# Patient Record
Sex: Female | Born: 1960 | Race: White | Hispanic: No | Marital: Married | Smoking: Never smoker
Health system: Southern US, Community
[De-identification: ages and names within clinical notes are randomized; demographics above are authoritative.]

## PROBLEM LIST (undated history)

## (undated) DIAGNOSIS — R112 Nausea with vomiting, unspecified: Secondary | ICD-10-CM

## (undated) DIAGNOSIS — M1990 Unspecified osteoarthritis, unspecified site: Secondary | ICD-10-CM

## (undated) DIAGNOSIS — Z9889 Other specified postprocedural states: Secondary | ICD-10-CM

## (undated) DIAGNOSIS — I10 Essential (primary) hypertension: Secondary | ICD-10-CM

## (undated) HISTORY — DX: Essential (primary) hypertension: I10

## (undated) HISTORY — PX: OTHER SURGICAL HISTORY: SHX169

## (undated) HISTORY — PX: TONSILLECTOMY: SUR1361

---

## 2006-10-07 ENCOUNTER — Ambulatory Visit (HOSPITAL_COMMUNITY): Admission: RE | Admit: 2006-10-07 | Discharge: 2006-10-07 | Payer: Self-pay | Admitting: Gastroenterology

## 2011-12-10 ENCOUNTER — Telehealth: Payer: Self-pay

## 2011-12-10 NOTE — Telephone Encounter (Signed)
Will forward to Katie per her request.  

## 2011-12-14 NOTE — Telephone Encounter (Signed)
This appt was moved to another provider as CY will not be in the office the day of her consult appt.Phone call complete.

## 2011-12-18 ENCOUNTER — Institutional Professional Consult (permissible substitution): Payer: Self-pay | Admitting: Internal Medicine

## 2011-12-23 ENCOUNTER — Ambulatory Visit (INDEPENDENT_AMBULATORY_CARE_PROVIDER_SITE_OTHER): Payer: 59 | Admitting: Internal Medicine

## 2011-12-23 ENCOUNTER — Ambulatory Visit (INDEPENDENT_AMBULATORY_CARE_PROVIDER_SITE_OTHER)
Admission: RE | Admit: 2011-12-23 | Discharge: 2011-12-23 | Disposition: A | Discharge status: Home / Self Care | Payer: 59 | Source: Ambulatory Visit | Attending: Internal Medicine | Admitting: Internal Medicine

## 2011-12-23 ENCOUNTER — Encounter: Payer: Self-pay | Admitting: Internal Medicine

## 2011-12-23 ENCOUNTER — Telehealth: Payer: Self-pay | Admitting: Internal Medicine

## 2011-12-23 VITALS — BP 132/84 | HR 86 | Temp 98.2°F | Ht 65.0 in | Wt 163.0 lb

## 2011-12-23 DIAGNOSIS — R059 Cough, unspecified: Secondary | ICD-10-CM | POA: Insufficient documentation

## 2011-12-23 DIAGNOSIS — R05 Cough: Secondary | ICD-10-CM

## 2011-12-23 MED ORDER — FAMOTIDINE 20 MG PO TABS: ORAL_TABLET | ORAL | Status: DC

## 2011-12-23 MED ORDER — DEXLANSOPRAZOLE 60 MG PO CPDR: 60.0000 mg | DELAYED_RELEASE_CAPSULE | Freq: Every day | ORAL | Status: DC

## 2011-12-23 MED ORDER — TRAMADOL HCL 50 MG PO TABS: ORAL_TABLET | ORAL | Status: AC

## 2011-12-23 NOTE — Progress Notes (Signed)
Quick Note:  Spoke with pt and notified of results per Dr. Wert. Pt verbalized understanding and denied any questions.  ______ 

## 2011-12-23 NOTE — Assessment & Plan Note (Signed)
The most common causes of chronic cough in immunocompetent adults include the following: upper airway cough syndrome (UACS), previously referred to as postnasal drip syndrome (PNDS), which is caused by variety of rhinosinus conditions; (2) asthma; (3) GERD; (4) chronic bronchitis from cigarette smoking or other inhaled environmental irritants; (5) nonasthmatic eosinophilic bronchitis; and (6) bronchiectasis.   These conditions, singly or in combination, have accounted for up to 94% of the causes of chronic cough in prospective studies.   Other conditions have constituted no >6% of the causes in prospective studies These have included bronchogenic carcinoma, chronic interstitial pneumonia, sarcoidosis, left ventricular failure, ACEI-induced cough, and aspiration from a condition associated with pharyngeal dysfunction. Chronic cough is often simultaneously caused by more than one condition. A single cause has been found from 38 to 82% of the time, multiple causes from 18 to 62%. Multiply caused cough has been the result of three diseases up to 42% of the time.   This is almost certainly a form of  Classic Upper airway cough syndrome, so named because it's frequently impossible to sort out how much is  CR/sinusitis with freq throat clearing (which can be related to primary GERD)   vs  causing  secondary (" extra esophageal")  GERD from wide swings in gastric pressure that occur with throat clearing, often  promoting self use of mint and menthol lozenges that reduce the lower esophageal sphincter tone and exacerbate the problem further in a cyclical fashion.   These are the same pts (now being labeled as having "irritable larynx syndrome" by some cough centers) who not infrequently have a history of having failed to tolerate ace inhibitors,  dry powder inhalers or biphosphonates or report having atypical reflux symptoms that don't respond to standard doses of PPI , and are easily confused as having aecopd or  asthma flares by even experienced allergists/ pulmonologists.   For now try max gerd rx and use 1st gen h1 to further dampen what is probably a vagally mediated reflex to reflux  Diet also reviewed.

## 2011-12-23 NOTE — Progress Notes (Signed)
Quick Note:  LMTCB ______ 

## 2011-12-23 NOTE — Progress Notes (Signed)
  Subjective:    Patient ID: Grace Pope, female    DOB: [birth year]  MRN: [id number]  HPI  49 yowf never regular smoker no childhood asthma with new onset cough Dec 2012 referred 12/23/2011 to pulmonary clinic by Dr Arelia Sneddon.  12/23/2011 1st pulmonary eval cc new onset cough spells abupt onset Dec 2012 [place] with days between episode fine but typical spell  starts with tickle in throat then severe cough itchy chin and can't stop for several hours initially late afternoon early evening rarely while sleeping.  No better with cough drops/ syrups or antihistamines.  In between can ex fine. Spells getting more freq and more severe to every 3rd day on avg now. Can't speak during spells. Nose starts running like a faucet during the episodes but no subjective wheeze and spells resolve p  Few hours regardless of what she takes for them, or takes nothing.  Denies choking on food or dysphagia.  In general, Sleeping ok without nocturnal  or early am exacerbation  of respiratory  c/o's or need for noct saba. Also denies any obvious fluctuation of symptoms with weather or environmental changes or other aggravating or alleviating factors except as outlined above     Review of Systems  Constitutional: Negative for fever, chills and unexpected weight change.  HENT: Positive for congestion and sneezing. Negative for ear pain, nosebleeds, sore throat, rhinorrhea, trouble swallowing, dental problem, voice change, postnasal drip and sinus pressure.   Eyes: Negative for visual disturbance.  Respiratory: Positive for cough and shortness of breath. Negative for choking.   Cardiovascular: Negative for chest pain and leg swelling.  Gastrointestinal: Negative for vomiting, abdominal pain and diarrhea.  Genitourinary: Negative for difficulty urinating.  Musculoskeletal: Negative for arthralgias.  Skin: Positive for rash.  Neurological: Positive for headaches. Negative for tremors and syncope.  Hematological: Does not  bruise/bleed easily.       Objective:   Physical Exam  Ambulatory wf nad  Wt 163 12/23/2011   HEENT: nl dentition, turbinates, and orophanx. Nl external ear canals without cough reflex   NECK :  without JVD/Nodes/TM/ nl carotid upstrokes bilaterally   LUNGS: no acc muscle use, clear to A and P bilaterally without cough on insp or exp maneuvers   CV:  RRR  no s3 or murmur or increase in P2, no edema   ABD:  soft and nontender with nl excursion in the supine position. No bruits or organomegaly, bowel sounds nl  MS:  warm without deformities, calf tenderness, cyanosis or clubbing  SKIN: warm and dry without lesions    NEURO:  alert, approp, no deficits   CXR  12/23/2011 :  1. No active disease. Mild degenerative changes mid thoracic spine.       Assessment & Plan:

## 2011-12-23 NOTE — Telephone Encounter (Signed)
Spoke with pt and notified of results per Dr. Wert. Pt verbalized understanding and denied any questions. 

## 2011-12-23 NOTE — Patient Instructions (Addendum)
GERD (REFLUX)  is an extremely common cause of respiratory symptoms just like yours, many times with no significant heartburn at all.    It can be treated with medication, but also with lifestyle changes including avoidance of late meals, excessive alcohol, smoking cessation, and avoid fatty foods, chocolate, peppermint, colas, red wine, and acidic juices such as orange juice.  NO MINT OR MENTHOL PRODUCTS SO NO COUGH DROPS  USE SUGARLESS CANDY INSTEAD (jolley ranchers or Stover's)  NO OIL BASED VITAMINS - use powdered substitutes. No fish oil    Dexilant 60 mg Take 30-60 min before first meal of the day and Pepcid 20 mg on at bedtime automatically for now  Take chlortrimeton 4 mg one every 4 hours as needed.   If still coughing, tramadol 50mg   Up to 1-2 every 4 hours.  Please remember to go to  x-ray department downstairs for your tests - we will call you with the results when they are available.    Please schedule a follow up office visit in 2 weeks, sooner if needed

## 2012-01-06 ENCOUNTER — Encounter: Payer: Self-pay | Admitting: Internal Medicine

## 2012-01-06 ENCOUNTER — Ambulatory Visit (INDEPENDENT_AMBULATORY_CARE_PROVIDER_SITE_OTHER): Payer: 59 | Admitting: Internal Medicine

## 2012-01-06 ENCOUNTER — Other Ambulatory Visit (INDEPENDENT_AMBULATORY_CARE_PROVIDER_SITE_OTHER): Payer: 59

## 2012-01-06 VITALS — BP 124/90 | HR 81 | Temp 98.2°F | Ht 65.0 in | Wt 162.0 lb

## 2012-01-06 DIAGNOSIS — R05 Cough: Secondary | ICD-10-CM

## 2012-01-06 LAB — CBC WITH DIFFERENTIAL/PLATELET
Basophils Absolute: 0 10*3/uL (ref 0.0–0.1)
Eosinophils Relative: 4.2 % (ref 0.0–5.0)
HCT: 42.9 % (ref 36.0–46.0)
Lymphocytes Relative: 22.2 % (ref 12.0–46.0)
Monocytes Relative: 7.6 % (ref 3.0–12.0)
Neutrophils Relative %: 65.6 % (ref 43.0–77.0)
Platelets: 204 10*3/uL (ref 150.0–400.0)
RDW: 12.9 % (ref 11.5–14.6)
WBC: 10.8 10*3/uL — ABNORMAL HIGH (ref 4.5–10.5)

## 2012-01-06 MED ORDER — FAMOTIDINE 20 MG PO TABS: ORAL_TABLET | ORAL | Status: DC

## 2012-01-06 MED ORDER — PREDNISONE (PAK) 10 MG PO TABS: ORAL_TABLET | ORAL | Status: AC

## 2012-01-06 NOTE — Patient Instructions (Addendum)
Please remember to go to the lab  department downstairs for your tests - we will call you with the results when they are available.    Zyrtec 10 mg one at bedtime  Prednisone 10 mg take  4 each am x 2 days,   2 each am x 2 days,  1 each am x2days and stop   Pepcid 20mg  one after bfast and one after supper  GERD (REFLUX)  is an extremely common cause of respiratory symptoms, many times with no significant heartburn at all.    It can be treated with medication, but also with lifestyle changes including avoidance of late meals, excessive alcohol, smoking cessation, and avoid fatty foods, chocolate, peppermint, colas, red wine, and acidic juices such as orange juice.  NO MINT OR MENTHOL PRODUCTS SO NO COUGH DROPS  USE SUGARLESS CANDY INSTEAD (jolley ranchers or Stover's)  NO OIL BASED VITAMINS - use powdered substitutes.    If cough, immediately take tramadol 50 mg one every 4 hours.  Please schedule a follow up office visit in 2 weeks, sooner if needed

## 2012-01-06 NOTE — Progress Notes (Signed)
Subjective:    Patient ID: Grace Pope, female    DOB: 1960/11/06  MRN: [id number]  HPI  73 yowf never regular smoker no childhood asthma with background pattern of daily cough x decades perhaps worse in the fall in the past  but acutely worse since Dec 2012 referred 12/23/2011 to pulmonary clinic by Dr Arelia Sneddon.  12/23/2011 1st pulmonary eval cc new onset severe cough spells abupt onset Dec 2012 [place] with days between episode "fine"  (= back to daily throat clearling)  but typical spell  starts with tickle in throat then severe cough itchy chin and can't stop for several hours initially late afternoon early evening rarely while sleeping.  No better with cough drops/ syrups or antihistamines.  In between can ex fine. Spells getting more freq and more severe to every 3rd day on avg now. Can't speak during spells. Nose starts running like a faucet during the episodes but no subjective wheeze and spells resolve p  Few hours regardless of what she takes for them, or takes nothing.  Denies choking on food or dysphagia. rec GERD diet  Dexilant 60 mg Take 30-60 min before first meal of the day and Pepcid 20 mg on at bedtime automatically for now Take chlortrimeton 4 mg one every 4 hours as needed.  If still coughing, tramadol 50mg   up to 1-2 every 4 hours.    01/06/2012 f/u ov/Bayley Hurn cc no pattern did not try chlortrimeton at onset, can't speak and wheezes during the episode, has not tried tramadol. No sob unless coughing. Takes voice away. No vomiting or gagging  In general, Sleeping ok without nocturnal  or early am exacerbation  of respiratory  c/o's or need for noct saba. Also denies any obvious fluctuation of symptoms with weather or environmental changes or other aggravating or alleviating factors except as outlined above.  ROS  At present neg for  any significant sore throat, dysphagia, dental problems, itching, sneezing,  nasal congestion or excess/ purulent secretions, ear ache,   fever,  chills, sweats, unintended wt loss, pleuritic or exertional cp, hemoptysis, palpitations, orthopnea pnd or leg swelling.  Also denies presyncope, palpitations, heartburn, abdominal pain, anorexia, nausea, vomiting, diarrhea  or change in bowel or urinary habits, change in stools or urine, dysuria,hematuria,  rash, arthralgias, visual complaints, headache, numbness weakness or ataxia or problems with walking or coordination. No noted change in mood/affect or memory.                           Objective:   Physical Exam  Ambulatory wf nad freq throat clearing during interview and exam  Wt 163 12/23/2011 > 01/06/2012  162  HEENT: nl dentition, turbinates, and orophanx. Nl external ear canals without cough reflex   NECK :  without JVD/Nodes/TM/ nl carotid upstrokes bilaterally   LUNGS: no acc muscle use, clear to A and P bilaterally without cough on insp or exp maneuvers   CV:  RRR  no s3 or murmur or increase in P2, no edema   ABD:  soft and nontender with nl excursion in the supine position. No bruits or organomegaly, bowel sounds nl  MS:  warm without deformities, calf tenderness, cyanosis or clubbing  SKIN: warm and dry without lesions    NEURO:  alert, approp, no deficits    CXR  12/23/2011 :  1. No active disease. Mild degenerative changes mid thoracic spine.       Assessment & Plan:

## 2012-01-07 ENCOUNTER — Encounter: Payer: Self-pay | Admitting: Internal Medicine

## 2012-01-07 LAB — ALLERGY PROFILE REGION II-DC, DE, MD, ~~LOC~~, VA
Allergen, D pternoyssinus,d7: <0.1 kU/L (ref <0.35)
Alternaria Alternata: <0.1 kU/L (ref <0.35)
Bermuda Grass: <0.1 kU/L (ref <0.35)
Box Elder IgE: <0.1 kU/L (ref <0.35)
Cat Dander: <0.1 kU/L (ref <0.35)
Cockroach: <0.1 kU/L (ref <0.35)
Common Ragweed: <0.1 kU/L (ref <0.35)
D. farinae: <0.1 kU/L (ref <0.35)
Dog Dander: <0.1 kU/L (ref <0.35)
Oak: <0.1 kU/L (ref <0.35)
Pecan/Hickory Tree IgE: <0.1 kU/L (ref <0.35)

## 2012-01-07 NOTE — Assessment & Plan Note (Addendum)
-   Allergy profile  01/06/2012 > no eos > IgE 3.2 no resp allergens identified  This is almost certainly a form of  Classic Upper airway cough syndrome, so named because it's frequently impossible to sort out how much is  CR/sinusitis with freq throat clearing (which can be related to primary GERD)   vs  causing  secondary (" extra esophageal")  GERD from wide swings in gastric pressure that occur with throat clearing, often  promoting self use of mint and menthol lozenges that reduce the lower esophageal sphincter tone and exacerbate the problem further in a cyclical fashion.   These are the same pts (now being labeled as having "irritable larynx syndrome" by some cough centers) who not infrequently have a history of having failed to tolerate ace inhibitors,  dry powder inhalers or biphosphonates or report having atypical reflux symptoms that don't respond to standard doses of PPI , and are easily confused as having aecopd or asthma flares by even experienced allergists/ pulmonologists.    New hx obtained today is that this throat clearing has been going on for 30 years and is likely not going to respond to conventional rx but anecdotally may respond to neurotin and or elavil. For now will see if we can aggressively interrupt the cough inducing gerd inducing cough cycle she's stuck in chronically and go from there.  See instructions for specific recommendations which were reviewed directly with the patient who was given a copy with highlighter outlining the key components.

## 2012-01-11 NOTE — Progress Notes (Signed)
Quick Note:  Spoke with pt and notified of results per Dr. Wert. Pt verbalized understanding and denied any questions.  ______ 

## 2012-01-21 ENCOUNTER — Ambulatory Visit: Payer: 59 | Admitting: Internal Medicine

## 2012-01-22 ENCOUNTER — Encounter: Payer: Self-pay | Admitting: Internal Medicine

## 2012-01-22 ENCOUNTER — Ambulatory Visit (INDEPENDENT_AMBULATORY_CARE_PROVIDER_SITE_OTHER): Payer: 59 | Admitting: Internal Medicine

## 2012-01-22 VITALS — BP 120/80 | HR 72 | Temp 97.9°F | Ht 65.0 in | Wt 163.0 lb

## 2012-01-22 DIAGNOSIS — R05 Cough: Secondary | ICD-10-CM

## 2012-01-22 NOTE — Progress Notes (Signed)
Subjective:    Patient ID: Grace Pope, female    DOB: Nov 12, 1960  MRN: [id number]  HPI  3 yowf never regular smoker no childhood asthma with background pattern of daily cough x decades perhaps worse in the fall in the past  but acutely worse since Dec 2012 referred 12/23/2011 to pulmonary clinic by Dr Arelia Sneddon.  12/23/2011 1st pulmonary eval cc new onset severe cough spells abupt onset Dec 2012 [place] with days between episode "fine"  (= back to daily throat clearling)  but typical spell  starts with tickle in throat then severe cough itchy chin and can't stop for several hours initially late afternoon early evening rarely while sleeping.  No better with cough drops/ syrups or antihistamines.  In between can ex fine. Spells getting more freq and more severe to every 3rd day on avg now. Can't speak during spells. Nose starts running like a faucet during the episodes but no subjective wheeze and spells resolve p  Few hours regardless of what she takes for them, or takes nothing.  Denies choking on food or dysphagia. rec GERD diet  Dexilant 60 mg Take 30-60 min before first meal of the day and Pepcid 20 mg on at bedtime automatically for now Take chlortrimeton 4 mg one every 4 hours as needed.  If still coughing, tramadol 50mg   up to 1-2 every 4 hours.    01/06/2012 f/u ov/Grace Pope cc no pattern did not try chlortrimeton at onset, can't speak and wheezes during the episode, has not tried tramadol. No sob unless coughing. Takes voice away. No vomiting or gagging. rec Please remember to go to the lab  department downstairs for your tests - we will call you with the results when they are available.    Zyrtec 10 mg one at bedtime  Prednisone 10 mg take  4 each am x 2 days,   2 each am x 2 days,  1 each am x2days and stop   Pepcid 20mg  one after bfast and one after supper  GERD diet   01/22/12 ov/ Grace Pope > complete resolution of all cough and throat clearing p rx with prednisone. No sob at all nor  hoarseness - not even an urge to clear her throat.     Sleeping ok without nocturnal  or early am exacerbation  of respiratory  c/o's or need for noct saba. Also denies any obvious fluctuation of symptoms with weather or environmental changes or other aggravating or alleviating factors except as outlined above.  ROS  At present neg for  any significant sore throat, dysphagia, dental problems, itching, sneezing,  nasal congestion or excess/ purulent secretions, ear ache,   fever, chills, sweats, unintended wt loss, pleuritic or exertional cp, hemoptysis, palpitations, orthopnea pnd or leg swelling.  Also denies presyncope, palpitations, heartburn, abdominal pain, anorexia, nausea, vomiting, diarrhea  or change in bowel or urinary habits, change in stools or urine, dysuria,hematuria,  rash, arthralgias, visual complaints, headache, numbness weakness or ataxia or problems with walking or coordination. No noted change in mood/affect or memory.                      Objective:   Physical Exam  Ambulatory wf nad    Wt 163 12/23/2011 > 01/06/2012  162> 01/22/12 wt 163   HEENT: nl dentition, turbinates, and orophanx. Nl external ear canals without cough reflex   NECK :  without JVD/Nodes/TM/ nl carotid upstrokes bilaterally   LUNGS: no acc muscle use, clear to  A and P bilaterally without cough on insp or exp maneuvers   CV:  RRR  no s3 or murmur or increase in P2, no edema   ABD:  soft and nontender with nl excursion in the supine position. No bruits or organomegaly, bowel sounds nl  MS:  warm without deformities, calf tenderness, cyanosis or clubbing  SKIN: warm and dry without lesions    NEURO:  alert, approp, no deficits    CXR  12/23/2011 :  1. No active disease. Mild degenerative changes mid thoracic spine.       Assessment & Plan:

## 2012-01-22 NOTE — Patient Instructions (Signed)
For severe cough take tramadol and suppress acid   The next step in the work up if get worse again is a methacholine challenge test call [phone] ask for [facility] and she'll schedule it and I will cause

## 2012-01-23 NOTE — Assessment & Plan Note (Signed)
Dramatic response to prednisone suggests either cough variant asthma, eos bronchitis or rhinitis, or, the most likely, that the cyclical cough mechanism going on for years generated secondary upper airways inflammation that only resolved when she stopped coughing long enough for the prednisone to stop the inflammation  Of the three most common causes of chronic cough, only one (GERD)  can actually cause the other two (asthma and post nasal drip syndrome)  and perpetuate the cylce of cough inducing airway trauma, inflammation, heightened sensitivity to reflux which is prompted by the cough itself via a cyclical mechanism.    This may partially respond to steroids and look like asthma and post nasal drainage but never erradicated completely unless the cough and the secondary reflux are eliminated, preferably both at the same time.  While not intuitively obvious, many patients with chronic low grade reflux do not cough until there is a secondary insult that disturbs the protective epithelial barrier and exposes sensitive nerve endings.  This can be viral or direct physical injury such as with an endotracheal tube.   The point is that once this occurs, it is difficult to eliminate using anything but a maximally effective acid suppression regimen at least in the short run, accompanied by an appropriate diet to address non acid GERD.   For now will just follow conservatively but the next step is a methacholine challenge while on gerd rx to r/o primary airways dz

## 2012-02-09 ENCOUNTER — Other Ambulatory Visit: Payer: Self-pay | Admitting: Dermatology

## 2012-02-12 ENCOUNTER — Other Ambulatory Visit: Payer: Self-pay | Admitting: Internal Medicine

## 2012-02-12 DIAGNOSIS — R05 Cough: Secondary | ICD-10-CM

## 2012-02-23 ENCOUNTER — Encounter (HOSPITAL_COMMUNITY): Payer: 59

## 2012-02-29 ENCOUNTER — Ambulatory Visit (HOSPITAL_COMMUNITY)
Admission: RE | Admit: 2012-02-29 | Discharge: 2012-02-29 | Disposition: A | Discharge status: Home / Self Care | Payer: 59 | Source: Ambulatory Visit | Attending: Internal Medicine | Admitting: Internal Medicine

## 2012-02-29 DIAGNOSIS — R05 Cough: Secondary | ICD-10-CM | POA: Insufficient documentation

## 2012-02-29 DIAGNOSIS — R059 Cough, unspecified: Secondary | ICD-10-CM | POA: Insufficient documentation

## 2012-02-29 LAB — PULMONARY FUNCTION TEST

## 2012-02-29 MED ORDER — SODIUM CHLORIDE 0.9 % IN NEBU
3.0000 mL | INHALATION_SOLUTION | Freq: Once | RESPIRATORY_TRACT | Status: AC
  Administered 2012-02-29: 3 mL via RESPIRATORY_TRACT

## 2012-02-29 MED ORDER — METHACHOLINE 4 MG/ML NEB SOLN
2.0000 mL | Freq: Once | RESPIRATORY_TRACT | Status: AC
  Administered 2012-02-29: 8 mg via RESPIRATORY_TRACT

## 2012-02-29 MED ORDER — METHACHOLINE 0.25 MG/ML NEB SOLN
2.0000 mL | Freq: Once | RESPIRATORY_TRACT | Status: AC
  Administered 2012-02-29: 0.5 mg via RESPIRATORY_TRACT

## 2012-02-29 MED ORDER — ALBUTEROL SULFATE (5 MG/ML) 0.5% IN NEBU
2.5000 mg | INHALATION_SOLUTION | Freq: Once | RESPIRATORY_TRACT | Status: AC
  Administered 2012-02-29: 2.5 mg via RESPIRATORY_TRACT

## 2012-02-29 MED ORDER — METHACHOLINE 16 MG/ML NEB SOLN
2.0000 mL | Freq: Once | RESPIRATORY_TRACT | Status: AC
  Administered 2012-02-29: 32 mg via RESPIRATORY_TRACT

## 2012-02-29 MED ORDER — METHACHOLINE 0.0625 MG/ML NEB SOLN
2.0000 mL | Freq: Once | RESPIRATORY_TRACT | Status: AC
  Administered 2012-02-29: 0.125 mg via RESPIRATORY_TRACT

## 2012-02-29 MED ORDER — METHACHOLINE 1 MG/ML NEB SOLN
2.0000 mL | Freq: Once | RESPIRATORY_TRACT | Status: AC
  Administered 2012-02-29: 2 mg via RESPIRATORY_TRACT

## 2012-03-02 ENCOUNTER — Telehealth: Payer: Self-pay | Admitting: Internal Medicine

## 2012-03-02 ENCOUNTER — Encounter: Payer: Self-pay | Admitting: Internal Medicine

## 2012-03-02 NOTE — Telephone Encounter (Signed)
Per MW MCT was neg for asthma, pt will need ov if still has co's.  ATC and inform the pt. LMTCB.

## 2012-03-03 NOTE — Telephone Encounter (Signed)
PT RETURNED CALL.  CALL HER BACK AT [phone] Grace Pope

## 2012-03-03 NOTE — Telephone Encounter (Signed)
Pt aware  Test results are negative for asthma. She verbalized understanding and said she would call if having any problems and make an appt.Marland Kitchen

## 2013-02-23 ENCOUNTER — Other Ambulatory Visit: Payer: Self-pay | Admitting: Dermatology

## 2015-05-09 HISTORY — PX: JOINT REPLACEMENT: SHX530

## 2015-06-17 ENCOUNTER — Encounter (HOSPITAL_COMMUNITY)
Admission: AD | Disposition: A | Discharge status: Home / Self Care | Payer: Self-pay | Source: Ambulatory Visit | Attending: Orthopedic Surgery

## 2015-06-17 ENCOUNTER — Encounter (HOSPITAL_COMMUNITY): Payer: Self-pay

## 2015-06-17 ENCOUNTER — Observation Stay (HOSPITAL_COMMUNITY): Payer: 59 | Admitting: Anesthesiology

## 2015-06-17 ENCOUNTER — Observation Stay (HOSPITAL_COMMUNITY)
Admission: AD | Admit: 2015-06-17 | Discharge: 2015-06-18 | Disposition: A | Discharge status: Home / Self Care | Payer: 59 | Source: Ambulatory Visit | Attending: Orthopedic Surgery | Admitting: Orthopedic Surgery

## 2015-06-17 DIAGNOSIS — M25562 Pain in left knee: Secondary | ICD-10-CM | POA: Insufficient documentation

## 2015-06-17 DIAGNOSIS — X58XXXA Exposure to other specified factors, initial encounter: Secondary | ICD-10-CM | POA: Insufficient documentation

## 2015-06-17 DIAGNOSIS — Z96652 Presence of left artificial knee joint: Secondary | ICD-10-CM | POA: Insufficient documentation

## 2015-06-17 DIAGNOSIS — T84028A Dislocation of other internal joint prosthesis, initial encounter: Principal | ICD-10-CM | POA: Insufficient documentation

## 2015-06-17 DIAGNOSIS — Z79899 Other long term (current) drug therapy: Secondary | ICD-10-CM | POA: Insufficient documentation

## 2015-06-17 DIAGNOSIS — I10 Essential (primary) hypertension: Secondary | ICD-10-CM | POA: Insufficient documentation

## 2015-06-17 DIAGNOSIS — G8918 Other acute postprocedural pain: Secondary | ICD-10-CM | POA: Diagnosis present

## 2015-06-17 DIAGNOSIS — Z791 Long term (current) use of non-steroidal anti-inflammatories (NSAID): Secondary | ICD-10-CM | POA: Insufficient documentation

## 2015-06-17 DIAGNOSIS — M25569 Pain in unspecified knee: Secondary | ICD-10-CM | POA: Diagnosis present

## 2015-06-17 HISTORY — DX: Other specified postprocedural states: R11.2

## 2015-06-17 HISTORY — DX: Other specified postprocedural states: Z98.890

## 2015-06-17 HISTORY — PX: I&D KNEE WITH POLY EXCHANGE: SHX5024

## 2015-06-17 LAB — SURGICAL PCR SCREEN
MRSA, PCR: NEGATIVE
Staphylococcus aureus: NEGATIVE

## 2015-06-17 LAB — CBC
HEMATOCRIT: 40.5 % (ref 36.0–46.0)
Hemoglobin: 13.9 g/dL (ref 12.0–15.0)
MCH: 32.2 pg (ref 26.0–34.0)
MCHC: 34.3 g/dL (ref 30.0–36.0)
MCV: 93.8 fL (ref 78.0–100.0)
PLATELETS: 229 10*3/uL (ref 150–400)
RBC: 4.32 MIL/uL (ref 3.87–5.11)
RDW: 12.9 % (ref 11.5–15.5)
WBC: 9.7 10*3/uL (ref 4.0–10.5)

## 2015-06-17 LAB — URINALYSIS, ROUTINE W REFLEX MICROSCOPIC
Bilirubin Urine: NEGATIVE
GLUCOSE, UA: NEGATIVE mg/dL
HGB URINE DIPSTICK: NEGATIVE
Ketones, ur: NEGATIVE mg/dL
Leukocytes, UA: NEGATIVE
Nitrite: NEGATIVE
PH: 7 (ref 5.0–8.0)
PROTEIN: NEGATIVE mg/dL
Specific Gravity, Urine: 1.01 (ref 1.005–1.030)
Urobilinogen, UA: 0.2 mg/dL (ref 0.0–1.0)

## 2015-06-17 SURGERY — IRRIGATION AND DEBRIDEMENT KNEE WITH POLY EXCHANGE
Anesthesia: General | Site: Knee | Laterality: Left

## 2015-06-17 MED ORDER — SCOPOLAMINE 1 MG/3DAYS TD PT72
1.0000 | MEDICATED_PATCH | Freq: Once | TRANSDERMAL | Status: DC
  Administered 2015-06-17: 1.5 mg via TRANSDERMAL
  Filled 2015-06-17: qty 1

## 2015-06-17 MED ORDER — HYDROCHLOROTHIAZIDE 12.5 MG PO CAPS
12.5000 mg | ORAL_CAPSULE | Freq: Every day | ORAL | Status: DC
  Filled 2015-06-17: qty 1

## 2015-06-17 MED ORDER — DOCUSATE SODIUM 100 MG PO CAPS
100.0000 mg | ORAL_CAPSULE | Freq: Two times a day (BID) | ORAL | Status: DC
  Administered 2015-06-17: 100 mg via ORAL

## 2015-06-17 MED ORDER — MIDAZOLAM HCL 5 MG/5ML IJ SOLN
INTRAMUSCULAR | Status: DC | PRN
  Administered 2015-06-17: 2 mg via INTRAVENOUS

## 2015-06-17 MED ORDER — MIDAZOLAM HCL 2 MG/2ML IJ SOLN
INTRAMUSCULAR | Status: AC
  Filled 2015-06-17: qty 4

## 2015-06-17 MED ORDER — ASPIRIN EC 325 MG PO TBEC
325.0000 mg | DELAYED_RELEASE_TABLET | Freq: Two times a day (BID) | ORAL | Status: DC
  Administered 2015-06-18: 325 mg via ORAL
  Filled 2015-06-17 (×3): qty 1

## 2015-06-17 MED ORDER — CEFAZOLIN SODIUM-DEXTROSE 2-3 GM-% IV SOLR
2.0000 g | Freq: Four times a day (QID) | INTRAVENOUS | Status: AC
  Administered 2015-06-17 – 2015-06-18 (×2): 2 g via INTRAVENOUS
  Filled 2015-06-17 (×2): qty 50

## 2015-06-17 MED ORDER — 0.9 % SODIUM CHLORIDE (POUR BTL) OPTIME
TOPICAL | Status: DC | PRN
  Administered 2015-06-17: 1000 mL

## 2015-06-17 MED ORDER — ONDANSETRON HCL 4 MG/2ML IJ SOLN
INTRAMUSCULAR | Status: DC | PRN
  Administered 2015-06-17: 4 mg via INTRAVENOUS

## 2015-06-17 MED ORDER — PROMETHAZINE HCL 25 MG/ML IJ SOLN
INTRAMUSCULAR | Status: AC
  Administered 2015-06-17: 12.5 mg
  Filled 2015-06-17: qty 1

## 2015-06-17 MED ORDER — METOCLOPRAMIDE HCL 10 MG PO TABS: 5.0000 mg | ORAL_TABLET | Freq: Three times a day (TID) | ORAL | Status: DC | PRN

## 2015-06-17 MED ORDER — METOCLOPRAMIDE HCL 5 MG/ML IJ SOLN: 5.0000 mg | Freq: Three times a day (TID) | INTRAMUSCULAR | Status: DC | PRN

## 2015-06-17 MED ORDER — PROPOFOL 10 MG/ML IV BOLUS
INTRAVENOUS | Status: AC
  Filled 2015-06-17: qty 20

## 2015-06-17 MED ORDER — ONDANSETRON HCL 4 MG/2ML IJ SOLN: 4.0000 mg | Freq: Four times a day (QID) | INTRAMUSCULAR | Status: DC | PRN

## 2015-06-17 MED ORDER — SODIUM CHLORIDE 0.9 % IJ SOLN
INTRAMUSCULAR | Status: DC | PRN
  Administered 2015-06-17: 30 mL

## 2015-06-17 MED ORDER — FERROUS SULFATE 325 (65 FE) MG PO TABS
325.0000 mg | ORAL_TABLET | Freq: Three times a day (TID) | ORAL | Status: DC
  Administered 2015-06-18: 325 mg via ORAL
  Filled 2015-06-17 (×4): qty 1

## 2015-06-17 MED ORDER — HYDROMORPHONE HCL 1 MG/ML IJ SOLN: 0.5000 mg | INTRAMUSCULAR | Status: DC | PRN

## 2015-06-17 MED ORDER — BUPIVACAINE-EPINEPHRINE (PF) 0.25% -1:200000 IJ SOLN
INTRAMUSCULAR | Status: AC
  Filled 2015-06-17: qty 30

## 2015-06-17 MED ORDER — HYDROMORPHONE HCL 1 MG/ML IJ SOLN: 0.2500 mg | INTRAMUSCULAR | Status: DC | PRN

## 2015-06-17 MED ORDER — SODIUM CHLORIDE 0.9 % IJ SOLN
INTRAMUSCULAR | Status: AC
  Filled 2015-06-17: qty 50

## 2015-06-17 MED ORDER — DIPHENHYDRAMINE HCL 25 MG PO CAPS: 25.0000 mg | ORAL_CAPSULE | Freq: Four times a day (QID) | ORAL | Status: DC | PRN

## 2015-06-17 MED ORDER — VALSARTAN-HYDROCHLOROTHIAZIDE 80-12.5 MG PO TABS: 1.0000 | ORAL_TABLET | Freq: Every day | ORAL | Status: DC

## 2015-06-17 MED ORDER — DEXAMETHASONE SODIUM PHOSPHATE 10 MG/ML IJ SOLN
INTRAMUSCULAR | Status: DC | PRN
  Administered 2015-06-17: 10 mg via INTRAVENOUS

## 2015-06-17 MED ORDER — SODIUM CHLORIDE 0.9 % IR SOLN
Status: DC | PRN
  Administered 2015-06-17: 3000 mL

## 2015-06-17 MED ORDER — FENTANYL CITRATE (PF) 250 MCG/5ML IJ SOLN
INTRAMUSCULAR | Status: AC
  Filled 2015-06-17: qty 25

## 2015-06-17 MED ORDER — KETOROLAC TROMETHAMINE 30 MG/ML IJ SOLN
INTRAMUSCULAR | Status: DC | PRN
  Administered 2015-06-17: 30 mg via INTRAMUSCULAR

## 2015-06-17 MED ORDER — ALUM & MAG HYDROXIDE-SIMETH 200-200-20 MG/5ML PO SUSP: 30.0000 mL | ORAL | Status: DC | PRN

## 2015-06-17 MED ORDER — PROPOFOL 10 MG/ML IV BOLUS
INTRAVENOUS | Status: DC | PRN
  Administered 2015-06-17: 20 mg via INTRAVENOUS
  Administered 2015-06-17: 200 mg via INTRAVENOUS

## 2015-06-17 MED ORDER — KETOROLAC TROMETHAMINE 30 MG/ML IJ SOLN
INTRAMUSCULAR | Status: AC
  Filled 2015-06-17: qty 1

## 2015-06-17 MED ORDER — LIDOCAINE HCL (CARDIAC) 20 MG/ML IV SOLN
INTRAVENOUS | Status: AC
  Filled 2015-06-17: qty 5

## 2015-06-17 MED ORDER — MAGNESIUM CITRATE PO SOLN: 1.0000 | Freq: Once | ORAL | Status: DC | PRN

## 2015-06-17 MED ORDER — HYDROMORPHONE HCL 1 MG/ML IJ SOLN
0.5000 mg | INTRAMUSCULAR | Status: DC | PRN
Start: 2015-06-17 — End: 2015-06-18

## 2015-06-17 MED ORDER — HYDROCODONE-ACETAMINOPHEN 7.5-325 MG PO TABS: 1.0000 | ORAL_TABLET | ORAL | Status: DC

## 2015-06-17 MED ORDER — CELECOXIB 200 MG PO CAPS
200.0000 mg | ORAL_CAPSULE | Freq: Two times a day (BID) | ORAL | Status: DC
  Administered 2015-06-17: 200 mg via ORAL
  Filled 2015-06-17 (×3): qty 1

## 2015-06-17 MED ORDER — BUPIVACAINE-EPINEPHRINE (PF) 0.25% -1:200000 IJ SOLN
INTRAMUSCULAR | Status: DC | PRN
  Administered 2015-06-17: 30 mL

## 2015-06-17 MED ORDER — IRBESARTAN 75 MG PO TABS
75.0000 mg | ORAL_TABLET | Freq: Every day | ORAL | Status: DC
  Filled 2015-06-17: qty 1

## 2015-06-17 MED ORDER — LACTATED RINGERS IV SOLN
INTRAVENOUS | Status: DC
  Administered 2015-06-17: 19:00:00 via INTRAVENOUS
  Administered 2015-06-17: 1000 mL via INTRAVENOUS

## 2015-06-17 MED ORDER — METHOCARBAMOL 1000 MG/10ML IJ SOLN
500.0000 mg | Freq: Four times a day (QID) | INTRAVENOUS | Status: DC | PRN
  Filled 2015-06-17: qty 5

## 2015-06-17 MED ORDER — SCOPOLAMINE 1 MG/3DAYS TD PT72
MEDICATED_PATCH | TRANSDERMAL | Status: AC
  Filled 2015-06-17: qty 1

## 2015-06-17 MED ORDER — PHENOL 1.4 % MT LIQD
1.0000 | OROMUCOSAL | Status: DC | PRN
Start: 2015-06-17 — End: 2015-06-18

## 2015-06-17 MED ORDER — MENTHOL 3 MG MT LOZG: 1.0000 | LOZENGE | OROMUCOSAL | Status: DC | PRN

## 2015-06-17 MED ORDER — DEXAMETHASONE SODIUM PHOSPHATE 10 MG/ML IJ SOLN
10.0000 mg | Freq: Once | INTRAMUSCULAR | Status: DC
  Filled 2015-06-17: qty 1

## 2015-06-17 MED ORDER — CEFAZOLIN SODIUM-DEXTROSE 2-3 GM-% IV SOLR
INTRAVENOUS | Status: AC
  Filled 2015-06-17: qty 50

## 2015-06-17 MED ORDER — ONDANSETRON HCL 4 MG PO TABS: 4.0000 mg | ORAL_TABLET | Freq: Four times a day (QID) | ORAL | Status: DC | PRN

## 2015-06-17 MED ORDER — LIDOCAINE HCL (CARDIAC) 20 MG/ML IV SOLN
INTRAVENOUS | Status: DC | PRN
  Administered 2015-06-17: 100 mg via INTRAVENOUS

## 2015-06-17 MED ORDER — CEFAZOLIN SODIUM-DEXTROSE 2-3 GM-% IV SOLR
2.0000 g | INTRAVENOUS | Status: AC
  Administered 2015-06-17: 2 g via INTRAVENOUS

## 2015-06-17 MED ORDER — METHOCARBAMOL 500 MG PO TABS: 500.0000 mg | ORAL_TABLET | Freq: Four times a day (QID) | ORAL | Status: DC | PRN

## 2015-06-17 MED ORDER — BISACODYL 10 MG RE SUPP: 10.0000 mg | Freq: Every day | RECTAL | Status: DC | PRN

## 2015-06-17 MED ORDER — SODIUM CHLORIDE 0.9 % IV SOLN
INTRAVENOUS | Status: DC
  Administered 2015-06-17: 23:00:00 via INTRAVENOUS
  Filled 2015-06-17 (×3): qty 1000

## 2015-06-17 MED ORDER — POLYETHYLENE GLYCOL 3350 17 G PO PACK: 17.0000 g | PACK | Freq: Two times a day (BID) | ORAL | Status: DC

## 2015-06-17 MED ORDER — FENTANYL CITRATE (PF) 100 MCG/2ML IJ SOLN
INTRAMUSCULAR | Status: DC | PRN
  Administered 2015-06-17 (×3): 50 ug via INTRAVENOUS

## 2015-06-17 SURGICAL SUPPLY — 61 items
BAG SPEC THK2 15X12 ZIP CLS (MISCELLANEOUS) ×1
BAG ZIPLOCK 12X15 (MISCELLANEOUS) ×3 IMPLANT
BANDAGE ELASTIC 4 VELCRO ST LF (GAUZE/BANDAGES/DRESSINGS) IMPLANT
BANDAGE ELASTIC 6 VELCRO ST LF (GAUZE/BANDAGES/DRESSINGS) ×3 IMPLANT
BANDAGE ESMARK 6X9 LF (GAUZE/BANDAGES/DRESSINGS) ×1 IMPLANT
BEARING MENISCAL TIBIAL 5 XS L (Orthopedic Implant) ×2 IMPLANT
BNDG CMPR 9X6 STRL LF SNTH (GAUZE/BANDAGES/DRESSINGS) ×1
BNDG ESMARK 6X9 LF (GAUZE/BANDAGES/DRESSINGS) ×3
BRNG TIB XS 5 PHS 3 UNCMP (Orthopedic Implant) ×1 IMPLANT
CUFF TOURN SGL QUICK 34 (TOURNIQUET CUFF) ×3
CUFF TRNQT CYL 34X4X40X1 (TOURNIQUET CUFF) ×1 IMPLANT
DECANTER SPIKE VIAL GLASS SM (MISCELLANEOUS) ×3 IMPLANT
DRAPE EXTREMITY T 121X128X90 (DRAPE) ×3 IMPLANT
DRAPE POUCH INSTRU U-SHP 10X18 (DRAPES) ×3 IMPLANT
DRAPE U-SHAPE 47X51 STRL (DRAPES) ×3 IMPLANT
DRSG ADAPTIC 3X8 NADH LF (GAUZE/BANDAGES/DRESSINGS) IMPLANT
DRSG AQUACEL AG ADV 3.5X10 (GAUZE/BANDAGES/DRESSINGS) ×2 IMPLANT
DRSG AQUACEL AG ADV 3.5X14 (GAUZE/BANDAGES/DRESSINGS) IMPLANT
DRSG PAD ABDOMINAL 8X10 ST (GAUZE/BANDAGES/DRESSINGS) IMPLANT
DURAPREP 26ML APPLICATOR (WOUND CARE) ×4 IMPLANT
ELECT REM PT RETURN 9FT ADLT (ELECTROSURGICAL) ×3
ELECTRODE REM PT RTRN 9FT ADLT (ELECTROSURGICAL) ×1 IMPLANT
FACESHIELD WRAPAROUND (MASK) ×15 IMPLANT
GAUZE SPONGE 4X4 12PLY STRL (GAUZE/BANDAGES/DRESSINGS) IMPLANT
GLOVE BIOGEL PI IND STRL 7.5 (GLOVE) ×1 IMPLANT
GLOVE BIOGEL PI IND STRL 8.5 (GLOVE) ×1 IMPLANT
GLOVE BIOGEL PI INDICATOR 7.5 (GLOVE) ×2
GLOVE BIOGEL PI INDICATOR 8.5 (GLOVE) ×2
GLOVE ECLIPSE 8.0 STRL XLNG CF (GLOVE) ×3 IMPLANT
GLOVE ORTHO TXT STRL SZ7.5 (GLOVE) ×3 IMPLANT
GOWN SPEC L3 XXLG W/TWL (GOWN DISPOSABLE) ×3 IMPLANT
GOWN STRL REUS W/TWL LRG LVL3 (GOWN DISPOSABLE) ×3 IMPLANT
HANDPIECE INTERPULSE COAX TIP (DISPOSABLE) ×3
KIT BASIN OR (CUSTOM PROCEDURE TRAY) ×3 IMPLANT
LIQUID BAND (GAUZE/BANDAGES/DRESSINGS) ×3 IMPLANT
MANIFOLD NEPTUNE II (INSTRUMENTS) ×3 IMPLANT
NDL SAFETY ECLIPSE 18X1.5 (NEEDLE) ×2 IMPLANT
NEEDLE HYPO 18GX1.5 SHARP (NEEDLE) ×6
NS IRRIG 1000ML POUR BTL (IV SOLUTION) ×4 IMPLANT
PACK TOTAL JOINT (CUSTOM PROCEDURE TRAY) ×3 IMPLANT
PADDING CAST COTTON 6X4 STRL (CAST SUPPLIES) IMPLANT
POSITIONER SURGICAL ARM (MISCELLANEOUS) ×1 IMPLANT
SET HNDPC FAN SPRY TIP SCT (DISPOSABLE) ×1 IMPLANT
SPONGE LAP 18X18 X RAY DECT (DISPOSABLE) IMPLANT
STAPLER VISISTAT 35W (STAPLE) IMPLANT
SUCTION FRAZIER 12FR DISP (SUCTIONS) ×3 IMPLANT
SUT MNCRL AB 3-0 PS2 18 (SUTURE) ×2 IMPLANT
SUT MNCRL AB 4-0 PS2 18 (SUTURE) ×2 IMPLANT
SUT VIC AB 1 CT1 36 (SUTURE) ×3 IMPLANT
SUT VIC AB 2-0 CT1 27 (SUTURE) ×9
SUT VIC AB 2-0 CT1 TAPERPNT 27 (SUTURE) ×3 IMPLANT
SUT VLOC 180 0 24IN GS25 (SUTURE) ×2 IMPLANT
SWAB COLLECTION DEVICE MRSA (MISCELLANEOUS) IMPLANT
SYR 50ML LL SCALE MARK (SYRINGE) ×3 IMPLANT
SYR CONTROL 10ML LL (SYRINGE) ×3 IMPLANT
TOWEL OR 17X26 10 PK STRL BLUE (TOWEL DISPOSABLE) ×3 IMPLANT
TRAY FOLEY W/METER SILVER 14FR (SET/KITS/TRAYS/PACK) ×1 IMPLANT
TRAY FOLEY W/METER SILVER 16FR (SET/KITS/TRAYS/PACK) ×1 IMPLANT
TUBE ANAEROBIC SPECIMEN COL (MISCELLANEOUS) IMPLANT
WATER STERILE IRR 1500ML POUR (IV SOLUTION) ×1 IMPLANT
WRAP KNEE MAXI GEL POST OP (GAUZE/BANDAGES/DRESSINGS) ×3 IMPLANT

## 2015-06-17 NOTE — Plan of Care (Signed)
Problem: Consults Goal: Total Joint Replacement Patient Education See Patient Education Module for education specifics. Outcome: Completed/Met Date Met:  06/17/15 Revision, UNI knee, Left

## 2015-06-17 NOTE — Brief Op Note (Signed)
06/17/2015  7:18 PM  PATIENT:  Grace Pope  54 y.o. 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   Delay start of Pharmacological VTE agent (>24hrs) due to surgical blood loss or risk of bleeding: no

## 2015-06-17 NOTE — Anesthesia Preprocedure Evaluation (Addendum)
Anesthesia Evaluation  Patient identified by MRN, date of birth, ID band Patient awake    History of Anesthesia Complications (+) PONV  Airway Mallampati: II  TM Distance: >3 FB Neck ROM: Full    Dental   Pulmonary neg pulmonary ROS,    breath sounds clear to auscultation       Cardiovascular hypertension,  Rhythm:Regular Rate:Normal     Neuro/Psych    GI/Hepatic negative GI ROS, Neg liver ROS,   Endo/Other  negative endocrine ROS  Renal/GU negative Renal ROS     Musculoskeletal   Abdominal   Peds  Hematology   Anesthesia Other Findings   Reproductive/Obstetrics                            Anesthesia Physical Anesthesia Plan  ASA: III  Anesthesia Plan:    Post-op Pain Management:    Induction: Intravenous  Airway Management Planned: LMA  Additional Equipment:   Intra-op Plan:   Post-operative Plan: Extubation in OR  Informed Consent: I have reviewed the patients History and Physical, chart, labs and discussed the procedure including the risks, benefits and alternatives for the proposed anesthesia with the patient or authorized representative who has indicated his/her understanding and acceptance.   Dental advisory given  Plan Discussed with: CRNA and Anesthesiologist  Anesthesia Plan Comments:         Anesthesia Quick Evaluation

## 2015-06-17 NOTE — H&P (Signed)
Grace Pope is an 54 y.o. female.    Chief Complaint: Acute left knee pain   HPI: Pt is a 54 y.o. female complaining of acute onset left knee pain during the night after twisting or rolling her leg.  She had immediate onset pain, difficulty with ambulation.  She contacted office and was seen this am by Leilani Able.  X-rays revealed dislocation of the polyethylene liner confirming clinical suspicion.  Findings reviewed with and X-rays reviewed.  Decided to add her on to the OR schedule today to address this acute problem.  PCP:  Lilia Argue  D/C Plans: To be determined following appropriate treatment plan  PMH: Past Medical History  Diagnosis Date  . Hypertension   . PONV (postoperative nausea and vomiting)     PSH: Past Surgical History  Procedure Laterality Date  . Joint replacement  05/09/2015    Partial left knee relacement  . Right knee arthroscopy    . Tonsillectomy      Social History:  reports that she has never smoked. She has never used smokeless tobacco. She reports that she drinks about 3.5 oz of alcohol per week. She reports that she does not use illicit drugs.  Allergies:  No Known Allergies  Medications: Medications Prior to Admission  Medication Sig Dispense Refill  . ibuprofen (ADVIL,MOTRIN) 200 MG tablet Take 400 mg by mouth every 6 (six) hours as needed for moderate pain.    Marland Kitchen KRILL OIL PO Take 1 capsule by mouth daily.    . Multiple Vitamin (MULTIVITAMIN) capsule Take 1 capsule by mouth daily.    . Naproxen Sod-Diphenhydramine (ALEVE PM) 220-25 MG TABS Take 1 tablet by mouth at bedtime as needed (for pain and sleep).    . naproxen sodium (ANAPROX) 220 MG tablet Take 220 mg by mouth 2 (two) times daily with a meal.    . psyllium (METAMUCIL) 58.6 % powder Take 1 packet by mouth daily.     . valsartan-hydrochlorothiazide (DIOVAN-HCT) 80-12.5 MG per tablet Take 1 tablet by mouth daily.      Results for orders placed or performed during the  hospital encounter of 06/17/15 (from the past 48 hour(s))  Urinalysis, Routine w reflex microscopic (not at [facility])     Status: None   Collection Time: 06/17/15 12:29 PM  Result Value Ref Range   Color, Urine YELLOW YELLOW   APPearance CLEAR CLEAR   Specific Gravity, Urine 1.010 1.005 - 1.030   pH 7.0 5.0 - 8.0   Glucose, UA NEGATIVE NEGATIVE mg/dL   Hgb urine dipstick NEGATIVE NEGATIVE   Bilirubin Urine NEGATIVE NEGATIVE   Ketones, ur NEGATIVE NEGATIVE mg/dL   Protein, ur NEGATIVE NEGATIVE mg/dL   Urobilinogen, UA 0.2 0.0 - 1.0 mg/dL   Nitrite NEGATIVE NEGATIVE   Leukocytes, UA NEGATIVE NEGATIVE    Comment: MICROSCOPIC NOT DONE ON URINES WITH NEGATIVE PROTEIN, BLOOD, LEUKOCYTES, NITRITE, OR GLUCOSE <1000 mg/dL.   No results found.  ROS: Review of Systems - Negative except that noted in her presenting history.  5-6 weeks out from left partial knee replacement  Physican Exam: Blood pressure 153/95, pulse 89, temperature 98.3 F (36.8 C), temperature source Oral, resp. rate 16.  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  Risks benefits and expectation were discussed with the patient. Patient understand risks, benefits and expectation and wishes to proceed.  Discharge to home tomorrow   Madlyn Frankel. Charlann Boxer, MD  06/17/2015, 2:41 PM

## 2015-06-17 NOTE — Anesthesia Postprocedure Evaluation (Signed)
  Anesthesia Post-op Note  Patient: Grace Pope  Procedure(s) Performed: Procedure(s): IRRIGATION AND DEBRIDEMENT KNEE WITH POLY EXCHANGE (Left)  Patient Location: PACU  Anesthesia Type:General  Level of Consciousness: awake  Airway and Oxygen Therapy: Patient Spontanous Breathing  Post-op Pain: mild  Post-op Assessment: Post-op Vital signs reviewed              Post-op Vital Signs: Reviewed  Last Vitals:  Filed Vitals:   06/17/15 2030  BP: 163/90  Pulse: 100  Temp: 36.8 C  Resp:     Complications: No apparent anesthesia complications

## 2015-06-17 NOTE — Transfer of Care (Signed)
Immediate Anesthesia Transfer of Care Note  Patient: Grace Pope  Procedure(s) Performed: Procedure(s): IRRIGATION AND DEBRIDEMENT KNEE WITH POLY EXCHANGE (Left)  Patient Location: PACU  Anesthesia Type:General  Level of Consciousness: sedated  Airway & Oxygen Therapy: Patient Spontanous Breathing and Patient connected to face mask oxygen  Post-op Assessment: Report given to RN and Post -op Vital signs reviewed and stable  Post vital signs: Reviewed and stable  Last Vitals:  Filed Vitals:   06/17/15 1541  BP: 137/73  Pulse: 82  Temp: 37.3 C  Resp: 18    Complications: No apparent anesthesia complications

## 2015-06-18 LAB — CBC
HEMATOCRIT: 38.2 % (ref 36.0–46.0)
HEMOGLOBIN: 13.1 g/dL (ref 12.0–15.0)
MCH: 32.1 pg (ref 26.0–34.0)
MCHC: 34.3 g/dL (ref 30.0–36.0)
MCV: 93.6 fL (ref 78.0–100.0)
Platelets: 232 10*3/uL (ref 150–400)
RBC: 4.08 MIL/uL (ref 3.87–5.11)
RDW: 12.8 % (ref 11.5–15.5)
WBC: 12 10*3/uL — ABNORMAL HIGH (ref 4.0–10.5)

## 2015-06-18 LAB — BASIC METABOLIC PANEL WITH GFR
Anion gap: 11 (ref 5–15)
BUN: 13 mg/dL (ref 6–20)
CO2: 24 mmol/L (ref 22–32)
Calcium: 9.4 mg/dL (ref 8.9–10.3)
Chloride: 104 mmol/L (ref 101–111)
Creatinine, Ser: 0.88 mg/dL (ref 0.44–1.00)
GFR calc Af Amer: >60 mL/min
GFR calc non Af Amer: >60 mL/min
Glucose, Bld: 168 mg/dL — ABNORMAL HIGH (ref 65–99)
Potassium: 4.2 mmol/L (ref 3.5–5.1)
Sodium: 139 mmol/L (ref 135–145)

## 2015-06-18 MED ORDER — FERROUS SULFATE 325 (65 FE) MG PO TABS: 325.0000 mg | ORAL_TABLET | Freq: Three times a day (TID) | ORAL | Status: DC

## 2015-06-18 MED ORDER — POLYETHYLENE GLYCOL 3350 17 G PO PACK: 17.0000 g | PACK | Freq: Two times a day (BID) | ORAL | Status: DC

## 2015-06-18 MED ORDER — HYDROCODONE-ACETAMINOPHEN 7.5-325 MG PO TABS: 1.0000 | ORAL_TABLET | ORAL | Status: DC | PRN

## 2015-06-18 MED ORDER — ASPIRIN 325 MG PO TBEC: 325.0000 mg | DELAYED_RELEASE_TABLET | Freq: Two times a day (BID) | ORAL | Status: AC

## 2015-06-18 MED ORDER — METHOCARBAMOL 500 MG PO TABS: 500.0000 mg | ORAL_TABLET | Freq: Four times a day (QID) | ORAL | Status: DC | PRN

## 2015-06-18 MED ORDER — DOCUSATE SODIUM 100 MG PO CAPS: 100.0000 mg | ORAL_CAPSULE | Freq: Two times a day (BID) | ORAL | Status: DC

## 2015-06-18 NOTE — Discharge Instructions (Signed)

## 2015-06-18 NOTE — Op Note (Signed)
Grace Pope, Grace Pope NO.:  [id number]  MEDICAL RECORD NO.:  [id number]  LOCATION:  1603                         FACILITY:  [facility]  PHYSICIAN:  Madlyn Frankel. Charlann Boxer, M.D.  DATE OF BIRTH:  07/20/1961  DATE OF PROCEDURE:  06/17/2015 DATE OF DISCHARGE:                              OPERATIVE REPORT   PREOPERATIVE DIAGNOSIS:  Status post left partial knee arthroplasty with an acute dislocation of the mobile bearing prosthesis.  POSTOPERATIVE DIAGNOSIS:  Status post left partial knee arthroplasty with an acute dislocation of the mobile bearing prosthesis.  PROCEDURE:  Left knee revision polyethylene, sizing up from size 4 polyethylene to a size 5 mm insert to match the extra-small femur on this left knee.  SURGEON:  Madlyn Frankel. Charlann Boxer, M.D.  ASSISTANT:  Surgical team.  ANESTHESIA:  General.  SPECIMENS:  None.  COMPLICATIONS:  None.  DRAINS:  None.  TOURNIQUET:  27 minutes at 250 mmHg.  FINDINGS:  The patient was noted to have no significant abnormalities that could explain the findings identified radiographically and clinically.  Her medial collateral ligament was intact, ACL intact. When I was in the operating room and trialed even with the size 4 trial, the knee felt stable.  I would like to go up to a size 5 mm to try to avert any potential other issues given this issue.  INDICATIONS FOR PROCEDURE:  Grace Pope is a very pleasant 54 year old female, within 6 weeks of her left partial knee arthroplasty and despite having some mechanical catching in her knee, recently resolved with physical therapy techniques.  She had been doing quite well, walking a mile yesterday.  She presented to the office today with an acute onset of pain after sleeping at night, rolling and tumbling around, moving from side-to-side where she felt some pop sensation in the knee. Radiographs in the office indicated a dislocated polyethylene insert.  Given these findings and acuity and  aggravation of the knee, we added her onto the OR case for today.  Risks, benefits, and necessity of the procedure were obviously reviewed.  Consent was obtained.  Risks of recurrent dislocation discussed.  Risks of infection, DVT, standard length reviewed as well.  Consent was obtained.  PROCEDURE IN DETAIL:  The patient was brought to the operative theater. Once adequate anesthesia, preoperative antibiotics, Ancef administered, she was positioned supine.  Her right unaffected extremity was held into the lithotomy leg holder.  With bony prominence padded, her left knee was positioned over the leg holder to allow for 120 degrees of flexion. Proximal thigh tourniquet placed.  The left lower extremity was then prepped and draped in sterile fashion.  Time-out was performed, identifying the patient, planned procedure, and extremity.  Leg was exsanguinated.  Tourniquet elevated to 250 mmHg.  Her old incision was utilized.  Soft tissue. Planes created.  Old sutures removed as identified.  I then made an arthrotomy and countering an old-appearing hemarthrosis.  The polyethylene had dislocated anteriorly and was easily removed.  Following an initial synovectomy, the soft tissues were visible as well as old suture.  I examined and identified the medial collateral ligament to be intact.  There was no evidence of any abnormality, bony or  otherwise identified through the incision site.  I then, at this point, trialed with both the size 4 and the size 5 lollipop, identified that the tension on the ligaments appeared to be appropriate, but elected to go with a size 5 to try to avert any other issues.  The new size 5 left medial insert to match the extra-small femur was snapped into place.  The knee was irrigated with normal saline solution. At this point, I reapproximated the extensor mechanism in flexion using #1 Vicryl.  The remaining wound was closed with 2-0 Vicryl and then a running 4-0  Monocryl.  The knee was cleaned, dried, and dressed sterilely using Dermabond.  At the time of closure, I injected the knee with 30 mL of 0.25% Marcaine with epinephrine, 1 mL of Toradol, and 30 mL of normal saline.  Upon conclusion of the case, the knee was cleaned, dried, and dressed sterilely using surgical glue, Aquacel dressing.  The knee was wrapped in Ace wrap.  She was then brought to the recovery room in stable condition, tolerating the procedure well.  Findings were reviewed with her husband.  She will stay in the hospital overnight for IV antibiotics, will be discharged tomorrow with activity as tolerated.     Madlyn Frankel Charlann Boxer, M.D.     MDO/MEDQ  D:  06/17/2015  T:  06/18/2015  Job:  [id number]

## 2015-06-18 NOTE — Progress Notes (Signed)
Patient refused to take the scripts for hydrocodone and robaxin at discharge. Charge nurse Danna Susann Givens and [employer] PA notified. Scripts were placed in shredder in [place] Franklin's presence.

## 2015-06-18 NOTE — Care Management Note (Signed)
Case Management Note  Patient Details  Name: Grace Pope MRN: [id number] Date of Birth: 1960-11-24  Subjective/Objective:                   IRRIGATION AND DEBRIDEMENT KNEE WITH POLY EXCHANGE (Left) Action/Plan:  Discharge planning Expected Discharge Date:  09/17/14               Expected Discharge Plan:  Home/Self Care  In-House Referral:     Discharge planning Services  CM Consult  Post Acute Care Choice:  NA Choice offered to:  NA  DME Arranged:  N/A DME Agency:  NA  HH Arranged:  NA HH Agency:     Status of Service:  Completed, signed off  Medicare Important Message Given:    Date Medicare IM Given:    Medicare IM give by:    Date Additional Medicare IM Given:    Additional Medicare Important Message give by:     If discussed at Long Length of Stay Meetings, dates discussed:    Additional Comments: CM notes pt has DME from previous surgery and no HHC ordered or recc.  No other CM needs were communicated. Yves Dill, RN 06/18/2015, 11:21 AM

## 2015-06-18 NOTE — Progress Notes (Signed)
Patient ID: Grace Pope, female   DOB: 1961/07/04, 54 y.o.   MRN: [id number] Subjective: 1 Day Post-Op Procedure(s) (LRB): IRRIGATION AND DEBRIDEMENT KNEE WITH POLY EXCHANGE (Left)    Patient reports pain as mild.  Very little discomfort  Objective:   VITALS:   Filed Vitals:   06/18/15 0654  BP: 128/76  Pulse: 65  Temp: 98.2 F (36.8 C)  Resp: 18    Neurovascular intact Incision: dressing C/D/I  LABS  Recent Labs  06/17/15 1440 06/18/15 0456  HGB 13.9 13.1  HCT 40.5 38.2  WBC 9.7 12.0*  PLT 229 232     Recent Labs  06/18/15 0456  NA 139  K 4.2  BUN 13  CREATININE 0.88  GLUCOSE 168*    No results for input(s): LABPT, INR in the last 72 hours.   Assessment/Plan: 1 Day Post-Op Procedure(s) (LRB): IRRIGATION AND DEBRIDEMENT KNEE WITH POLY EXCHANGE (Left)  Home this am No restrictions Therapy set up for Monday, she other wise knows what to do No therapy needed today

## 2015-06-19 ENCOUNTER — Encounter (HOSPITAL_COMMUNITY): Payer: Self-pay | Admitting: Orthopedic Surgery

## 2015-06-20 NOTE — Discharge Summary (Signed)
Physician Discharge Summary  Patient ID: Grace Pope MRN: [id number] DOB/AGE: 1961/04/26 54 y.o.  Admit date: 06/17/2015 Discharge date: 06/18/2015   Procedures:  Procedure(s) (LRB): IRRIGATION AND DEBRIDEMENT KNEE WITH POLY EXCHANGE (Left)  Attending Physician:  Dr. Durene Romans   Admission Diagnoses:   Acute left knee pain   Discharge Diagnoses:  Active Problems:   Acute postoperative pain of left knee   Acute postoperative pain of knee  Past Medical History  Diagnosis Date  . Hypertension   . PONV (postoperative nausea and vomiting)     HPI:    Pt is a 54 y.o. female complaining of acute onset left knee pain during the night after twisting or rolling her leg. She had immediate onset pain, difficulty with ambulation. She contacted office and was seen this am by Leilani Able. X-rays revealed dislocation of the polyethylene liner confirming clinical suspicion. Findings reviewed with and X-rays reviewed. Decided to add her on to the OR schedule today to address this acute problem.  PCP: Lilia Argue   Discharged Condition: good  Hospital Course:  Patient underwent the above stated procedure on 06/17/2015. Patient tolerated the procedure well and brought to the recovery room in good condition and subsequently to the floor.  POD #1 BP: 128/76 ; Pulse: 65 ; Temp: 98.2 F (36.8 C) ; Resp: 18 Patient reports pain as mild. Very little discomfort.  Ready to be discharged home. Dorsiflexion/plantar flexion intact, incision: dressing C/D/I, no cellulitis present and compartment soft.   LABS  Basename    HGB  13.1  HCT  38.2    Discharge Exam: General appearance: alert, cooperative and no distress Extremities: Homans sign is negative, no sign of DVT, no edema, redness or tenderness in the calves or thighs and no ulcers, gangrene or trophic changes  Disposition: Home with follow up in 2 weeks   Follow-up Information    Follow up with Shelda Pal, MD.  Schedule an appointment as soon as possible for a visit in 2 weeks.   Specialty:  Orthopedic Surgery   Contact information:   [address] [place] [address] [phone]       Discharge Instructions    Call MD / Call 911    Complete by:  As directed   If you experience chest pain or shortness of breath, CALL 911 and be transported to the hospital emergency room.  If you develope a fever above 101 F, pus (white drainage) or increased drainage or redness at the wound, or calf pain, call your surgeon's office.     Change dressing    Complete by:  As directed   Maintain surgical dressing until follow up in the clinic. If the edges start to pull up, may reinforce with tape. If the dressing is no longer working, may remove and cover with gauze and tape, but must keep the area dry and clean.  Call with any questions or concerns.     Constipation Prevention    Complete by:  As directed   Drink plenty of fluids.  Prune juice may be helpful.  You may use a stool softener, such as Colace (over the counter) 100 mg twice a day.  Use MiraLax (over the counter) for constipation as needed.     Diet - low sodium heart healthy    Complete by:  As directed      Discharge instructions    Complete by:  As directed   Maintain surgical dressing until follow up in  the clinic. If the edges start to pull up, may reinforce with tape. If the dressing is no longer working, may remove and cover with gauze and tape, but must keep the area dry and clean.  Follow up in 2 weeks at [facility] Call with any questions or concerns.     Increase activity slowly as tolerated    Complete by:  As directed   Weight bearing as tolerated with assist device (walker, cane, etc) as directed, use it as long as suggested by your surgeon or therapist, typically at least 4-6 weeks.     TED hose    Complete by:  As directed   Use stockings (TED hose) for 2 weeks on both leg(s).  You may remove them at  night for sleeping.             Medication List    STOP taking these medications        ALEVE PM 220-25 MG Tabs  Generic drug:  Naproxen Sod-Diphenhydramine     ibuprofen 200 MG tablet  Commonly known as:  ADVIL,MOTRIN     naproxen sodium 220 MG tablet  Commonly known as:  ANAPROX      TAKE these medications        aspirin 325 MG EC tablet  Take 1 tablet (325 mg total) by mouth 2 (two) times daily.     docusate sodium 100 MG capsule  Commonly known as:  COLACE  Take 1 capsule (100 mg total) by mouth 2 (two) times daily.     ferrous sulfate 325 (65 FE) MG tablet  Take 1 tablet (325 mg total) by mouth 3 (three) times daily after meals.     HYDROcodone-acetaminophen 7.5-325 MG tablet  Commonly known as:  NORCO  Take 1-2 tablets by mouth every 4 (four) hours as needed for moderate pain.     KRILL OIL PO  Take 1 capsule by mouth daily.     methocarbamol 500 MG tablet  Commonly known as:  ROBAXIN  Take 1 tablet (500 mg total) by mouth every 6 (six) hours as needed for muscle spasms.     multivitamin capsule  Take 1 capsule by mouth daily.     polyethylene glycol packet  Commonly known as:  MIRALAX / GLYCOLAX  Take 17 g by mouth 2 (two) times daily.     psyllium 58.6 % powder  Commonly known as:  METAMUCIL  Take 1 packet by mouth daily.     valsartan-hydrochlorothiazide 80-12.5 MG tablet  Commonly known as:  DIOVAN-HCT  Take 1 tablet by mouth daily.         Signed: Anastasio Auerbach. Emmagene Ortner   PA-C  06/20/2015, 11:44 AM

## 2015-08-30 ENCOUNTER — Emergency Department (HOSPITAL_COMMUNITY)
Admission: EM | Admit: 2015-08-30 | Discharge: 2015-08-30 | Disposition: A | Discharge status: Home / Self Care | Payer: 59 | Attending: Emergency Medicine | Admitting: Emergency Medicine

## 2015-08-30 ENCOUNTER — Encounter (HOSPITAL_COMMUNITY): Payer: Self-pay | Admitting: *Deleted

## 2015-08-30 ENCOUNTER — Emergency Department (HOSPITAL_COMMUNITY): Payer: 59

## 2015-08-30 DIAGNOSIS — I10 Essential (primary) hypertension: Secondary | ICD-10-CM | POA: Diagnosis not present

## 2015-08-30 DIAGNOSIS — Y9289 Other specified places as the place of occurrence of the external cause: Secondary | ICD-10-CM | POA: Insufficient documentation

## 2015-08-30 DIAGNOSIS — Y998 Other external cause status: Secondary | ICD-10-CM | POA: Insufficient documentation

## 2015-08-30 DIAGNOSIS — M25562 Pain in left knee: Secondary | ICD-10-CM

## 2015-08-30 DIAGNOSIS — Z9889 Other specified postprocedural states: Secondary | ICD-10-CM | POA: Diagnosis not present

## 2015-08-30 DIAGNOSIS — Z79899 Other long term (current) drug therapy: Secondary | ICD-10-CM | POA: Insufficient documentation

## 2015-08-30 DIAGNOSIS — S8992XA Unspecified injury of left lower leg, initial encounter: Secondary | ICD-10-CM | POA: Insufficient documentation

## 2015-08-30 DIAGNOSIS — Y9389 Activity, other specified: Secondary | ICD-10-CM | POA: Insufficient documentation

## 2015-08-30 DIAGNOSIS — W06XXXA Fall from bed, initial encounter: Secondary | ICD-10-CM | POA: Diagnosis not present

## 2015-08-30 DIAGNOSIS — Z791 Long term (current) use of non-steroidal anti-inflammatories (NSAID): Secondary | ICD-10-CM | POA: Diagnosis not present

## 2015-08-30 NOTE — ED Notes (Addendum)
Pt states she heard a pop at 730PM and felt pain in her left knee, was unable to bear weight. Pt states she had partial knee replacement surgery in August, which she states slipped out of place in October.Pt states she feels like her knee has slipped out of place again.

## 2015-08-30 NOTE — ED Provider Notes (Signed)
History  By signing my name below, I, Karle Plumber, attest that this documentation has been prepared under the direction and in the presence of [employer], PA-C. Electronically Signed: Karle Plumber, ED Scribe. 08/30/2015. 9:57 PM.  Chief Complaint  Patient presents with  . Knee Pain   The history is provided by the patient and medical records. No language interpreter was used.    HPI Comments:  Grace Pope is a 54 y.o. female who presents to the Emergency Department complaining of left knee soreness that began about 3.5 hours ago. Pt states she had a partial left knee replacement four months ago by Dr. Charlann Boxer. She reports that 5 weeks later one of the pieces slipped out of place and she had to have surgery again to fix it. She states about 3.5 hours ago she rolled over in bed and heard a pop and immediately felt pain in the knee. She applied her knee brace and has not been weight bearing since. She has not taken anything for pain. Bearing weight and certain movements of the left knee increase the pain. Resting the left knee helps to alleviate the pain. She denies numbness, tingling or weakness of the LLE, bruising or wounds. Pt has been ambulatory with crutches.  Past Medical History  Diagnosis Date  . Hypertension   . PONV (postoperative nausea and vomiting)    Past Surgical History  Procedure Laterality Date  . Joint replacement  05/09/2015    Partial left knee relacement  . Right knee arthroscopy    . Tonsillectomy    . I&d knee with poly exchange Left 06/17/2015    Procedure: IRRIGATION AND DEBRIDEMENT KNEE WITH POLY EXCHANGE;  Surgeon: Durene Romans, MD;  Location: [facility];  Service: Orthopedics;  Laterality: Left;   No family history on file. Social History  Substance Use Topics  . Smoking status: Never Smoker   . Smokeless tobacco: Never Used  . Alcohol Use: 3.5 oz/week    7 Standard drinks or equivalent per week   OB History    No data available     Review of  Systems  Musculoskeletal: Positive for arthralgias.  All other systems reviewed and are negative.   Allergies  Review of patient's allergies indicates no known allergies.  Home Medications   Prior to Admission medications   Medication Sig Start Date End Date Taking? Authorizing Provider  KRILL OIL PO Take 1 capsule by mouth daily.   Yes Historical Provider, MD  Multiple Vitamin (MULTIVITAMIN) capsule Take 1 capsule by mouth daily.   Yes Historical Provider, MD  Naproxen Sod-Diphenhydramine (ALEVE PM) 220-25 MG TABS Take 1 tablet by mouth at bedtime.   Yes Historical Provider, MD  valsartan-hydrochlorothiazide (DIOVAN-HCT) 80-12.5 MG per tablet Take 1 tablet by mouth daily.   Yes Historical Provider, MD  docusate sodium (COLACE) 100 MG capsule Take 1 capsule (100 mg total) by mouth 2 (two) times daily. Patient not taking: Reported on 08/30/2015 06/18/15   Lanney Gins, PA-C  ferrous sulfate 325 (65 FE) MG tablet Take 1 tablet (325 mg total) by mouth 3 (three) times daily after meals. Patient not taking: Reported on 08/30/2015 06/18/15   Lanney Gins, PA-C  HYDROcodone-acetaminophen (NORCO) 7.5-325 MG tablet Take 1-2 tablets by mouth every 4 (four) hours as needed for moderate pain. Patient not taking: Reported on 08/30/2015 06/18/15   Lanney Gins, PA-C  methocarbamol (ROBAXIN) 500 MG tablet Take 1 tablet (500 mg total) by mouth every 6 (six) hours as needed for muscle spasms.  Patient not taking: Reported on 08/30/2015 06/18/15   Lanney GinsMatthew Babish, PA-C  polyethylene glycol (MIRALAX / Ethelene HalGLYCOLAX) packet Take 17 g by mouth 2 (two) times daily. Patient not taking: Reported on 08/30/2015 06/18/15   Lanney GinsMatthew Babish, PA-C   Triage Vitals: BP 162/96 mmHg  Pulse 122  Temp(Src) 98 F (36.7 C) (Oral)  Resp 18  SpO2 98%  LMP 08/23/2015  Physical Exam  Constitutional: She is oriented to person, place, and time. She appears well-developed and well-nourished. No distress.  HENT:  Head:  Normocephalic and atraumatic.  Eyes: Conjunctivae and EOM are normal. No scleral icterus.  Neck: Normal range of motion.  Cardiovascular: Intact distal pulses.   DP and PT pulses 2+ in the LLE  Pulmonary/Chest: Effort normal. No respiratory distress.  Musculoskeletal: Normal range of motion. She exhibits tenderness.       Left knee: She exhibits swelling (mild). She exhibits normal range of motion, no effusion, no deformity, no erythema, no LCL laxity and no MCL laxity. Tenderness found. Medial joint line tenderness noted.       Legs: TTP at and superior to the medial joint line. Preserved ROM of the L knee. No effusion, deformity, or crepitus.  Neurological: She is alert and oriented to person, place, and time. She exhibits normal muscle tone. Coordination normal.  Sensation to light touch intact. Patient weight bearing with some discomfort.  Skin: Skin is warm and dry. No rash noted. She is not diaphoretic. No erythema. No pallor.  Psychiatric: She has a normal mood and affect. Her behavior is normal.  Nursing note and vitals reviewed.   ED Course  Procedures (including critical care time) DIAGNOSTIC STUDIES: Oxygen Saturation is 98% on RA, normal by my interpretation.   COORDINATION OF CARE: 9:54 PM- Will contact radiology about X-Ray. Pt verbalizes understanding and agrees to plan.  Medications - No data to display  Labs Review Labs Reviewed - No data to display  Imaging Review Dg Knee Complete 4 Views Left  08/30/2015  CLINICAL DATA:  Pain in knee.  Difficulty weight-bearing. EXAM: LEFT KNEE - COMPLETE 4+ VIEW COMPARISON:  None. FINDINGS: Previous left knee hemi arthroplasty with replacement of the medial compartment. The hardware components appear to be in anatomic alignment. No periprosthetic fracture or dislocation. 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 IMPRESSION: 1. No acute findings 2. Status post left knee hemi arthroplasty.  Electronically Signed   By: Signa Kellaylor  Stroud M.D.   On: 08/30/2015 21:23   I have personally reviewed and evaluated these images and lab results as part of my medical decision-making.   EKG Interpretation None      MDM   Final diagnoses:  Knee pain, left    54 year old female presents to the emergency department for evaluation of left knee pain. She reports hearing a "pop" at approximately [id number]. She is able to weight-bear in the emergency department, though this causes her some discomfort. Range of motion of her left knee is preserved. She is neurovascularly intact. X-ray obtained. I have called and spoken with the radiologist regarding these images. Radiologist states that he believes that all hardware is in good position.   Given patient's history of complications following her hemiarthroplasty, have advised her to remain nonweightbearing and to wear her knee brace at all times. She has been instructed to follow-up with Dr. Charlann Boxerlin, her orthopedist, for further evaluation of her symptoms. Patient requests that I reach out to Dr. Charlann Boxerlin as well regarding her visit to the emergency department.  I have sent an email through Epic to this effect. Return precautions discussed with the patient and provided at discharge. Patient discharged in good condition with no unaddressed concerns.  I personally performed the services described in this documentation, which was scribed in my presence. The recorded information has been reviewed and is accurate.        Antony Madura, PA-C 08/30/15 2227  Alvira Monday, MD 09/05/15 1341

## 2015-08-30 NOTE — Discharge Instructions (Signed)
Remain non-weightbearing until you see your orthopedist; use crutches at all times. Wear your brace at all times. Take ibuprofen or Aleve for pain as needed.  Cryotherapy Cryotherapy is when you put ice on your injury. Ice helps lessen pain and puffiness (swelling) after an injury. Ice works the best when you start using it in the first 24 to 48 hours after an injury. HOME CARE  Put a dry or damp towel between the ice pack and your skin.  You may press gently on the ice pack.  Leave the ice on for no more than 10 to 20 minutes at a time.  Check your skin after 5 minutes to make sure your skin is okay.  Rest at least 20 minutes between ice pack uses.  Stop using ice when your skin loses feeling (numbness).  Do not use ice on someone who cannot tell you when it hurts. This includes small children and people with memory problems (dementia). GET HELP RIGHT AWAY IF:  You have white spots on your skin.  Your skin turns blue or pale.  Your skin feels waxy or hard.  Your puffiness gets worse. MAKE SURE YOU:   Understand these instructions.  Will watch your condition.  Will get help right away if you are not doing well or get worse.   This information is not intended to replace advice given to you by your health care provider. Make sure you discuss any questions you have with your health care provider.   Document Released: 02/17/2008 Document Revised: 11/23/2011 Document Reviewed: 04/23/2011 Elsevier Interactive Patient Education [employer]2016 [employer]

## 2016-09-14 DIAGNOSIS — A4902 Methicillin resistant Staphylococcus aureus infection, unspecified site: Secondary | ICD-10-CM

## 2016-09-14 HISTORY — DX: Methicillin resistant Staphylococcus aureus infection, unspecified site: A49.02

## 2016-12-05 IMAGING — CR DG KNEE COMPLETE 4+V*L*
5 series · 5 of 5 positions shown · non-contrast
Comparison: None.

CLINICAL DATA: Pain in knee.  Difficulty weight-bearing.

EXAM:
LEFT KNEE - COMPLETE 4+ VIEW

[t knee ap left]
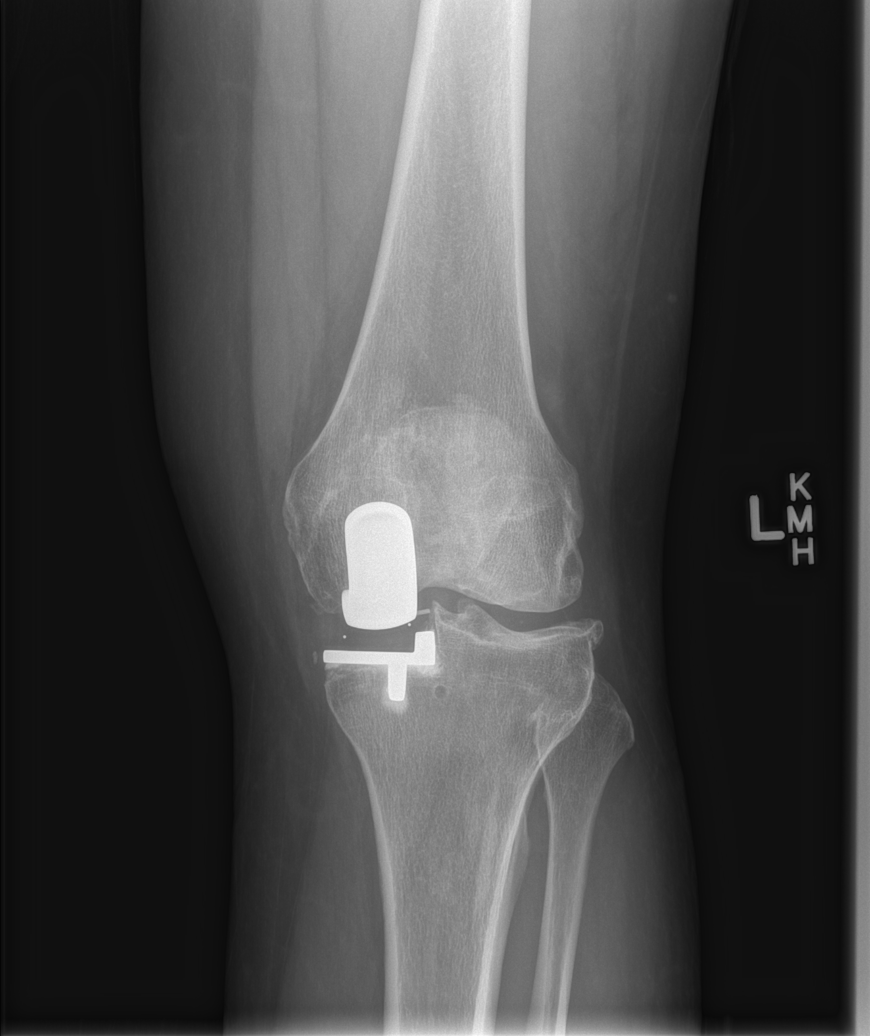

[t knee obl left (1 of 2)]
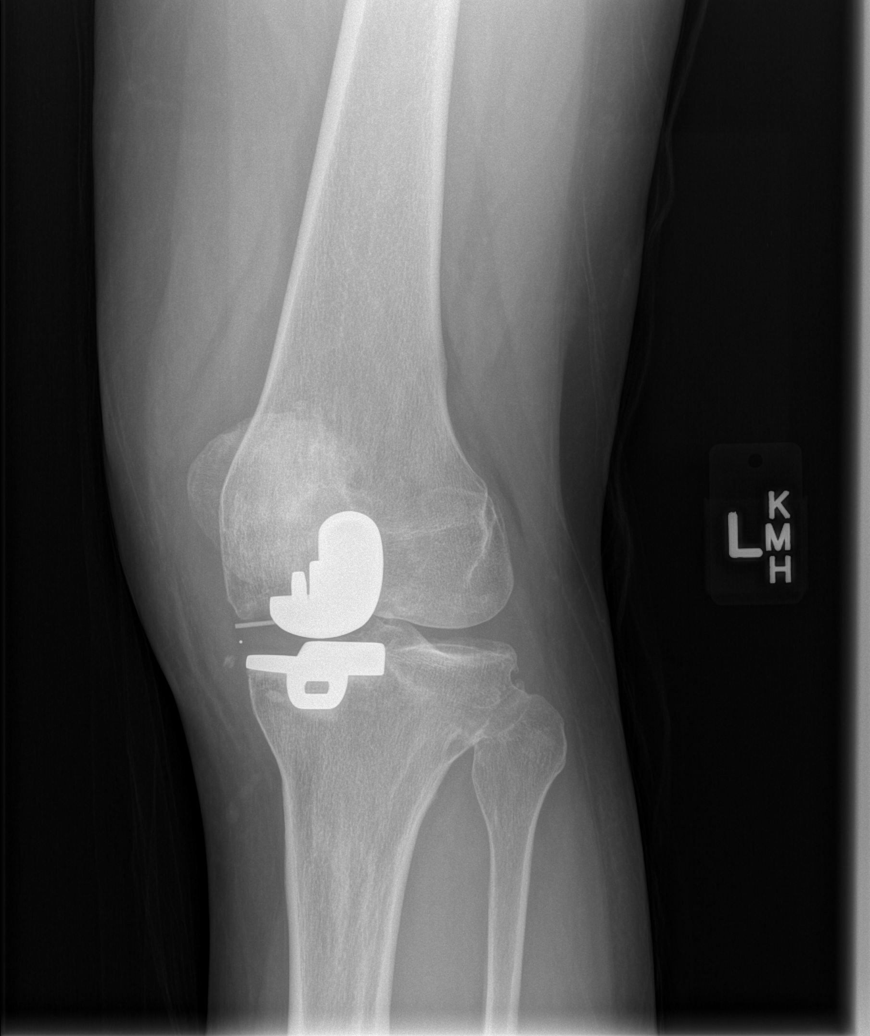

[t knee obl left (2 of 2)]
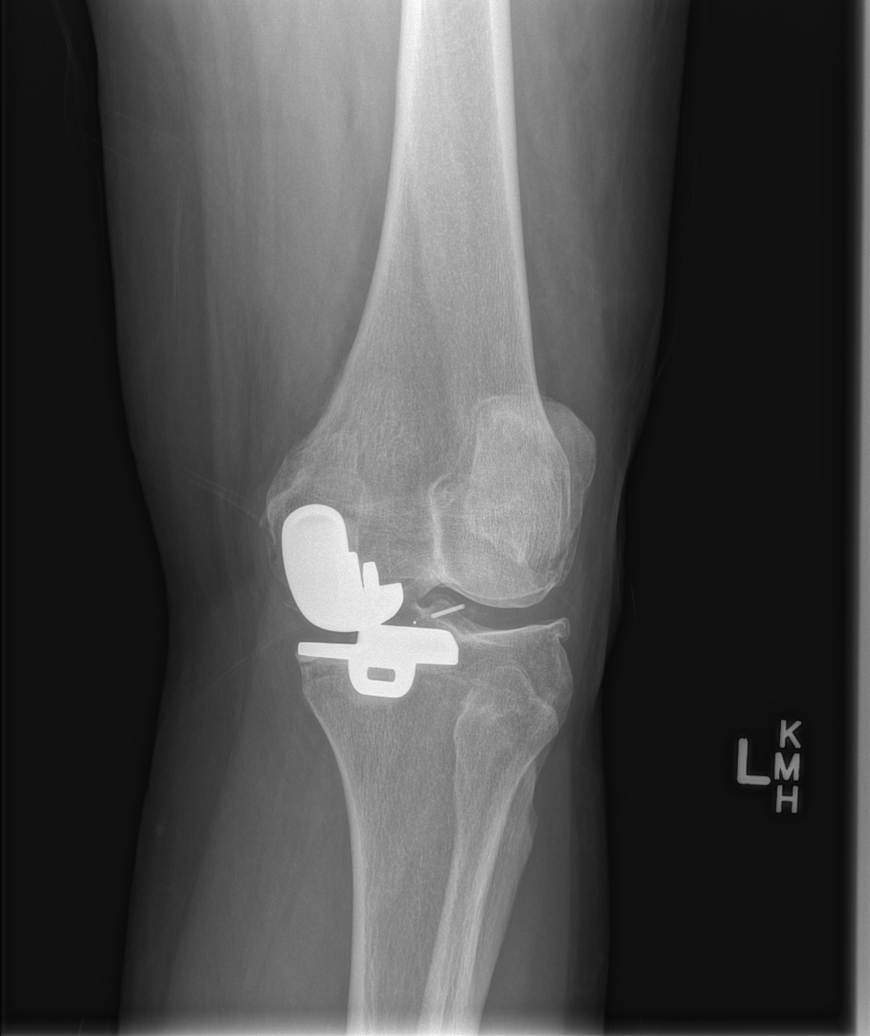

[t knee lat left (1 of 2)]
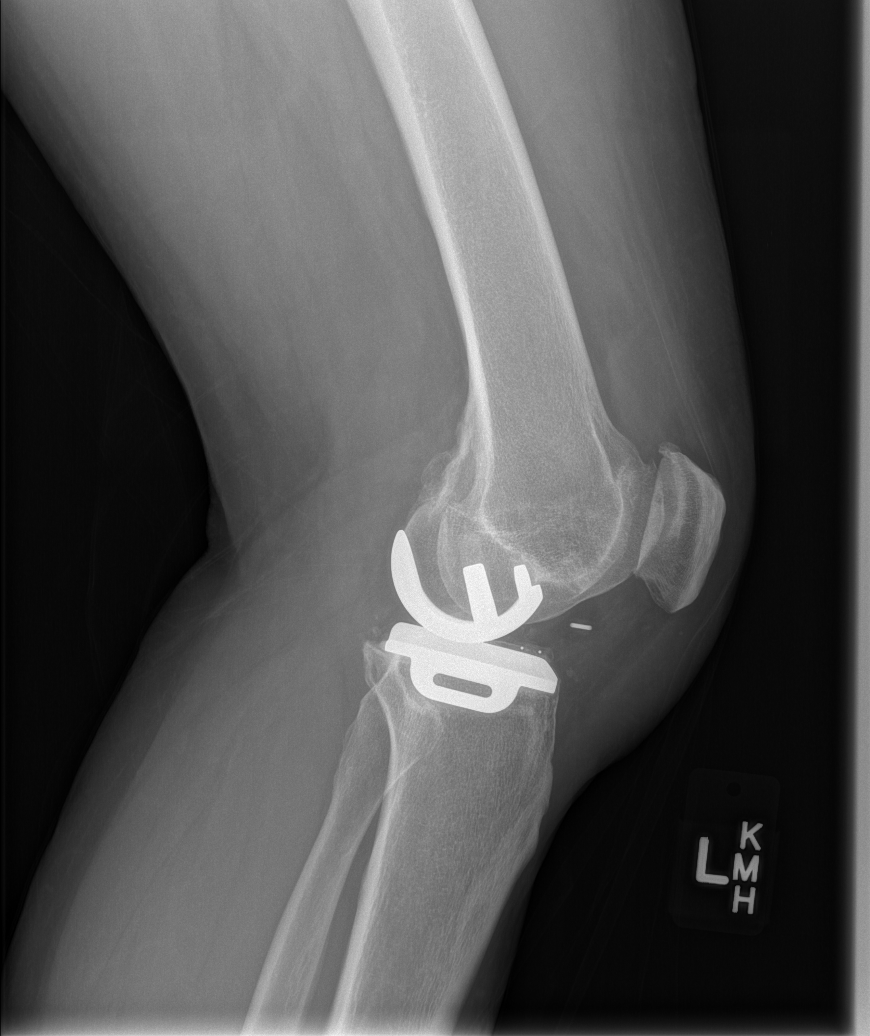

[t knee lat left (2 of 2)]
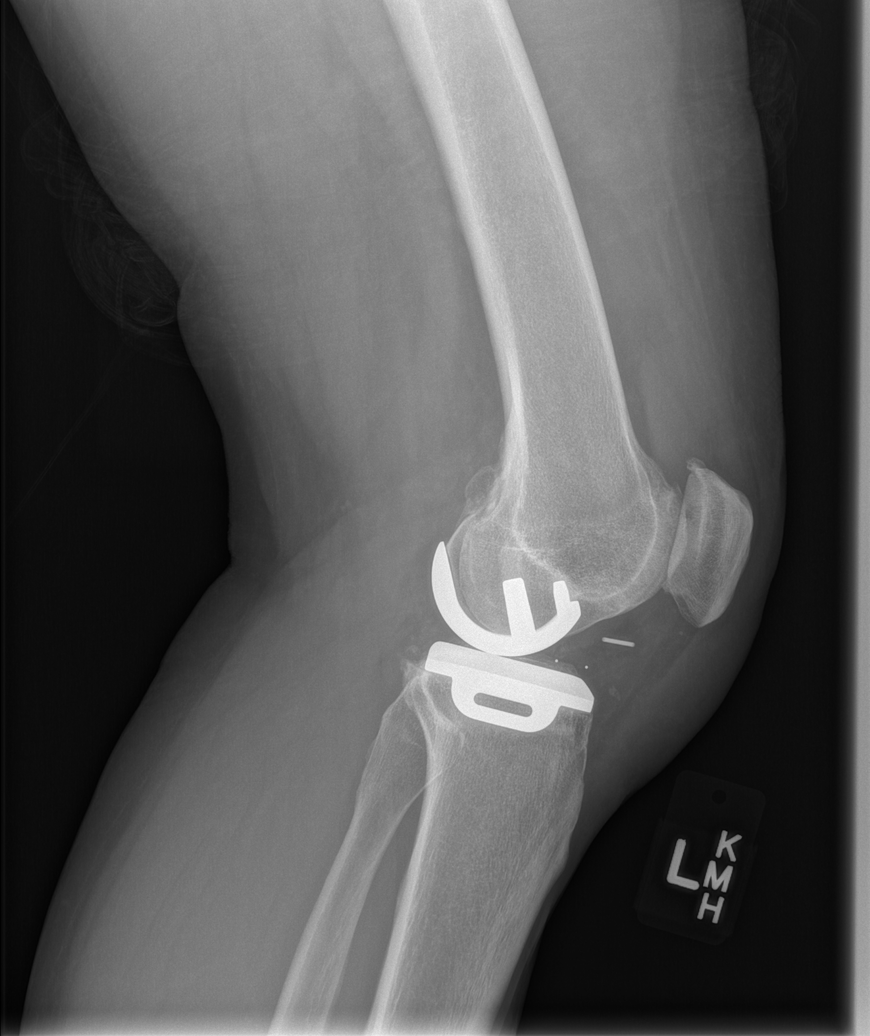

[5 of 5 positions shown; findings below may reference images not displayed]

FINDINGS: Previous left knee hemi arthroplasty with replacement of the medial
compartment. The hardware components appear to be in anatomic
alignment. No periprosthetic fracture or dislocation. Mild
osteoarthritis involving the patellofemoral and lateral compartments
noted.
IMPRESSION: 1. No acute findings
2. Status post left knee hemi arthroplasty.

## 2017-06-07 ENCOUNTER — Ambulatory Visit (HOSPITAL_COMMUNITY)
Admission: RE | Admit: 2017-06-07 | Discharge: 2017-06-07 | Disposition: A | Discharge status: Home / Self Care | Payer: BLUE CROSS/BLUE SHIELD | Source: Ambulatory Visit | Attending: Internal Medicine | Admitting: Internal Medicine

## 2017-06-07 ENCOUNTER — Encounter (HOSPITAL_COMMUNITY): Payer: Self-pay

## 2017-06-07 ENCOUNTER — Other Ambulatory Visit (HOSPITAL_COMMUNITY): Payer: Self-pay | Admitting: Orthopedic Surgery

## 2017-06-07 DIAGNOSIS — M1711 Unilateral primary osteoarthritis, right knee: Secondary | ICD-10-CM | POA: Insufficient documentation

## 2017-06-07 DIAGNOSIS — M7989 Other specified soft tissue disorders: Secondary | ICD-10-CM | POA: Insufficient documentation

## 2017-06-07 DIAGNOSIS — M79651 Pain in right thigh: Secondary | ICD-10-CM | POA: Diagnosis not present

## 2017-06-07 NOTE — Progress Notes (Signed)
Today's right lower extremity venous duplex is negative for DVT. Baker's cyst is noted in the right popliteal fossa. Preliminary results given to [place].

## 2020-11-04 ENCOUNTER — Other Ambulatory Visit (HOSPITAL_COMMUNITY): Payer: Self-pay | Admitting: Orthopedic Surgery

## 2020-11-20 ENCOUNTER — Encounter (HOSPITAL_BASED_OUTPATIENT_CLINIC_OR_DEPARTMENT_OTHER): Payer: Self-pay | Admitting: *Deleted

## 2020-11-20 ENCOUNTER — Other Ambulatory Visit: Payer: Self-pay

## 2020-11-25 ENCOUNTER — Other Ambulatory Visit (HOSPITAL_COMMUNITY)
Admission: RE | Admit: 2020-11-25 | Discharge: 2020-11-25 | Disposition: A | Discharge status: Home / Self Care | Payer: 59 | Source: Ambulatory Visit | Attending: Orthopedic Surgery | Admitting: Orthopedic Surgery

## 2020-11-25 ENCOUNTER — Other Ambulatory Visit: Payer: Self-pay

## 2020-11-25 ENCOUNTER — Encounter (HOSPITAL_BASED_OUTPATIENT_CLINIC_OR_DEPARTMENT_OTHER)
Admission: RE | Admit: 2020-11-25 | Discharge: 2020-11-25 | Disposition: A | Discharge status: Home / Self Care | Payer: 59 | Source: Ambulatory Visit | Attending: Orthopedic Surgery | Admitting: Orthopedic Surgery

## 2020-11-25 DIAGNOSIS — Z01818 Encounter for other preprocedural examination: Secondary | ICD-10-CM | POA: Insufficient documentation

## 2020-11-25 DIAGNOSIS — Z01812 Encounter for preprocedural laboratory examination: Secondary | ICD-10-CM | POA: Insufficient documentation

## 2020-11-25 DIAGNOSIS — Z20822 Contact with and (suspected) exposure to covid-19: Secondary | ICD-10-CM | POA: Insufficient documentation

## 2020-11-25 LAB — BASIC METABOLIC PANEL
Anion gap: 8 (ref 5–15)
BUN: 15 mg/dL (ref 6–20)
CO2: 26 mmol/L (ref 22–32)
Calcium: 9.7 mg/dL (ref 8.9–10.3)
Chloride: 104 mmol/L (ref 98–111)
Creatinine, Ser: 0.99 mg/dL (ref 0.44–1.00)
GFR, Estimated: >60 mL/min (ref >60)
Glucose, Bld: 107 mg/dL — ABNORMAL HIGH (ref 70–99)
Potassium: 3.8 mmol/L (ref 3.5–5.1)
Sodium: 138 mmol/L (ref 135–145)

## 2020-11-25 LAB — SARS CORONAVIRUS 2 (TAT 6-24 HRS): SARS Coronavirus 2: NEGATIVE

## 2020-11-28 ENCOUNTER — Ambulatory Visit (HOSPITAL_BASED_OUTPATIENT_CLINIC_OR_DEPARTMENT_OTHER): Admission: RE | Admit: 2020-11-28 | Payer: 59 | Source: Home / Self Care | Admitting: Orthopedic Surgery

## 2020-11-28 HISTORY — DX: Unspecified osteoarthritis, unspecified site: M19.90

## 2020-11-28 SURGERY — FUSION, JOINT, GREAT TOE
Anesthesia: General | Site: Toe | Laterality: Left

## 2021-09-18 DIAGNOSIS — I10 Essential (primary) hypertension: Secondary | ICD-10-CM | POA: Diagnosis not present

## 2021-12-25 DIAGNOSIS — Z20822 Contact with and (suspected) exposure to covid-19: Secondary | ICD-10-CM | POA: Diagnosis not present

## 2021-12-25 DIAGNOSIS — I10 Essential (primary) hypertension: Secondary | ICD-10-CM | POA: Diagnosis not present

## 2021-12-25 DIAGNOSIS — Z03818 Encounter for observation for suspected exposure to other biological agents ruled out: Secondary | ICD-10-CM | POA: Diagnosis not present

## 2021-12-25 DIAGNOSIS — J0100 Acute maxillary sinusitis, unspecified: Secondary | ICD-10-CM | POA: Diagnosis not present

## 2022-03-13 DIAGNOSIS — M216X2 Other acquired deformities of left foot: Secondary | ICD-10-CM | POA: Diagnosis not present

## 2022-03-13 DIAGNOSIS — M76822 Posterior tibial tendinitis, left leg: Secondary | ICD-10-CM | POA: Diagnosis not present

## 2022-03-18 DIAGNOSIS — M79672 Pain in left foot: Secondary | ICD-10-CM | POA: Diagnosis not present

## 2022-05-01 ENCOUNTER — Other Ambulatory Visit: Payer: Self-pay

## 2022-05-01 ENCOUNTER — Encounter (HOSPITAL_BASED_OUTPATIENT_CLINIC_OR_DEPARTMENT_OTHER): Payer: Self-pay

## 2022-05-01 DIAGNOSIS — Z5321 Procedure and treatment not carried out due to patient leaving prior to being seen by health care provider: Secondary | ICD-10-CM | POA: Insufficient documentation

## 2022-05-01 DIAGNOSIS — M76822 Posterior tibial tendinitis, left leg: Secondary | ICD-10-CM | POA: Diagnosis not present

## 2022-05-01 DIAGNOSIS — K047 Periapical abscess without sinus: Secondary | ICD-10-CM | POA: Insufficient documentation

## 2022-05-01 NOTE — ED Triage Notes (Signed)
Patient here POV from Home.  Endorses Dental Infection that began a few days ago and worsened today.  No Fevers. No Drainage Noted. Left Molar. Swelling as Well. Seeking Antibiotic Treatment as Patient could not See Dentist today.   NAD Noted during Triage. A&Ox4. GCS 15. Ambulatory.

## 2022-05-02 ENCOUNTER — Emergency Department (HOSPITAL_BASED_OUTPATIENT_CLINIC_OR_DEPARTMENT_OTHER)
Admission: EM | Admit: 2022-05-02 | Discharge: 2022-05-02 | Discharge status: Against Medical Advice | Payer: 59 | Attending: Emergency Medicine | Admitting: Emergency Medicine

## 2022-05-04 DIAGNOSIS — M25572 Pain in left ankle and joints of left foot: Secondary | ICD-10-CM | POA: Diagnosis not present

## 2022-05-08 DIAGNOSIS — M2142 Flat foot [pes planus] (acquired), left foot: Secondary | ICD-10-CM | POA: Diagnosis not present

## 2022-05-08 DIAGNOSIS — M76822 Posterior tibial tendinitis, left leg: Secondary | ICD-10-CM | POA: Diagnosis not present

## 2022-05-21 DIAGNOSIS — M25572 Pain in left ankle and joints of left foot: Secondary | ICD-10-CM | POA: Diagnosis not present

## 2022-05-21 DIAGNOSIS — J0100 Acute maxillary sinusitis, unspecified: Secondary | ICD-10-CM | POA: Diagnosis not present

## 2022-05-21 DIAGNOSIS — Z6830 Body mass index (BMI) 30.0-30.9, adult: Secondary | ICD-10-CM | POA: Diagnosis not present

## 2022-07-10 DIAGNOSIS — M76822 Posterior tibial tendinitis, left leg: Secondary | ICD-10-CM | POA: Diagnosis not present
